# Patient Record
Sex: Female | Born: 1955 | Race: White | Hispanic: No | Marital: Married | Smoking: Current every day smoker
Health system: Southern US, Community
[De-identification: ages and names within clinical notes are randomized; demographics above are authoritative.]

## PROBLEM LIST (undated history)

## (undated) DIAGNOSIS — I10 Essential (primary) hypertension: Secondary | ICD-10-CM

## (undated) DIAGNOSIS — E785 Hyperlipidemia, unspecified: Secondary | ICD-10-CM

## (undated) DIAGNOSIS — F1910 Other psychoactive substance abuse, uncomplicated: Secondary | ICD-10-CM

## (undated) DIAGNOSIS — I219 Acute myocardial infarction, unspecified: Secondary | ICD-10-CM

## (undated) DIAGNOSIS — Z720 Tobacco use: Secondary | ICD-10-CM

## (undated) DIAGNOSIS — I2510 Atherosclerotic heart disease of native coronary artery without angina pectoris: Secondary | ICD-10-CM

## (undated) DIAGNOSIS — I739 Peripheral vascular disease, unspecified: Secondary | ICD-10-CM

## (undated) DIAGNOSIS — I779 Disorder of arteries and arterioles, unspecified: Secondary | ICD-10-CM

## (undated) HISTORY — DX: Tobacco use: Z72.0

## (undated) HISTORY — DX: Disorder of arteries and arterioles, unspecified: I77.9

## (undated) HISTORY — PX: OTHER SURGICAL HISTORY: SHX169

## (undated) HISTORY — DX: Peripheral vascular disease, unspecified: I73.9

## (undated) HISTORY — DX: Hyperlipidemia, unspecified: E78.5

## (undated) HISTORY — PX: CERVICAL FUSION: SHX112

## (undated) HISTORY — DX: Atherosclerotic heart disease of native coronary artery without angina pectoris: I25.10

## (undated) HISTORY — DX: Other psychoactive substance abuse, uncomplicated: F19.10

## (undated) HISTORY — PX: VAGINAL HYSTERECTOMY: SUR661

## (undated) HISTORY — DX: Acute myocardial infarction, unspecified: I21.9

---

## 1997-10-27 ENCOUNTER — Ambulatory Visit: Admission: RE | Admit: 1997-10-27 | Discharge: 1997-10-27 | Payer: Self-pay | Admitting: Otolaryngology

## 1997-11-25 ENCOUNTER — Ambulatory Visit (HOSPITAL_BASED_OUTPATIENT_CLINIC_OR_DEPARTMENT_OTHER): Admission: RE | Admit: 1997-11-25 | Discharge: 1997-11-25 | Payer: Self-pay | Admitting: Otolaryngology

## 1998-10-06 ENCOUNTER — Ambulatory Visit (HOSPITAL_COMMUNITY): Admission: RE | Admit: 1998-10-06 | Discharge: 1998-10-06 | Payer: Self-pay | Admitting: Gastroenterology

## 1999-04-28 ENCOUNTER — Other Ambulatory Visit (HOSPITAL_COMMUNITY): Admission: RE | Admit: 1999-04-28 | Discharge: 1999-06-02 | Payer: Self-pay | Admitting: Psychiatry

## 2004-04-13 ENCOUNTER — Ambulatory Visit: Payer: Self-pay | Admitting: Internal Medicine

## 2004-04-15 ENCOUNTER — Ambulatory Visit: Payer: Self-pay | Admitting: *Deleted

## 2004-05-04 ENCOUNTER — Ambulatory Visit: Payer: Self-pay | Admitting: Internal Medicine

## 2006-06-14 ENCOUNTER — Encounter: Admission: RE | Admit: 2006-06-14 | Discharge: 2006-06-14 | Payer: Self-pay | Admitting: Family Medicine

## 2006-08-01 ENCOUNTER — Encounter: Admission: RE | Admit: 2006-08-01 | Discharge: 2006-08-01 | Payer: Self-pay | Admitting: Family Medicine

## 2006-11-24 ENCOUNTER — Encounter (INDEPENDENT_AMBULATORY_CARE_PROVIDER_SITE_OTHER): Payer: Self-pay | Admitting: Specialist

## 2006-11-24 ENCOUNTER — Inpatient Hospital Stay (HOSPITAL_COMMUNITY): Admission: RE | Admit: 2006-11-24 | Discharge: 2006-11-25 | Payer: Self-pay | Admitting: Obstetrics and Gynecology

## 2007-12-01 ENCOUNTER — Ambulatory Visit: Admission: RE | Admit: 2007-12-01 | Discharge: 2007-12-01 | Payer: Self-pay | Admitting: Family Medicine

## 2008-05-15 ENCOUNTER — Encounter: Payer: Self-pay | Admitting: Neurosurgery

## 2008-05-17 ENCOUNTER — Ambulatory Visit (HOSPITAL_COMMUNITY): Admission: RE | Admit: 2008-05-17 | Discharge: 2008-05-18 | Payer: Self-pay | Admitting: Neurosurgery

## 2008-05-25 ENCOUNTER — Emergency Department (HOSPITAL_COMMUNITY): Admission: EM | Admit: 2008-05-25 | Discharge: 2008-05-25 | Payer: Self-pay | Admitting: Emergency Medicine

## 2008-12-10 DIAGNOSIS — I219 Acute myocardial infarction, unspecified: Secondary | ICD-10-CM

## 2008-12-10 DIAGNOSIS — I2510 Atherosclerotic heart disease of native coronary artery without angina pectoris: Secondary | ICD-10-CM

## 2008-12-10 HISTORY — DX: Atherosclerotic heart disease of native coronary artery without angina pectoris: I25.10

## 2008-12-10 HISTORY — PX: CORONARY ANGIOPLASTY: SHX604

## 2008-12-10 HISTORY — DX: Acute myocardial infarction, unspecified: I21.9

## 2008-12-17 ENCOUNTER — Other Ambulatory Visit: Payer: Self-pay | Admitting: Emergency Medicine

## 2008-12-17 ENCOUNTER — Inpatient Hospital Stay (HOSPITAL_COMMUNITY): Admission: EM | Admit: 2008-12-17 | Discharge: 2008-12-19 | Payer: Self-pay | Admitting: Cardiology

## 2009-01-15 ENCOUNTER — Encounter (HOSPITAL_COMMUNITY): Admission: RE | Admit: 2009-01-15 | Discharge: 2009-02-14 | Payer: Self-pay | Admitting: Cardiology

## 2009-01-15 ENCOUNTER — Ambulatory Visit: Payer: Self-pay | Admitting: Vascular Surgery

## 2009-02-14 ENCOUNTER — Encounter (HOSPITAL_COMMUNITY): Admission: RE | Admit: 2009-02-14 | Discharge: 2009-03-16 | Payer: Self-pay | Admitting: Cardiology

## 2009-05-04 ENCOUNTER — Inpatient Hospital Stay (HOSPITAL_COMMUNITY): Admission: EM | Admit: 2009-05-04 | Discharge: 2009-05-08 | Payer: Self-pay | Admitting: Psychiatry

## 2009-05-04 ENCOUNTER — Ambulatory Visit: Payer: Self-pay | Admitting: Psychiatry

## 2009-05-04 ENCOUNTER — Emergency Department (HOSPITAL_COMMUNITY): Admission: EM | Admit: 2009-05-04 | Discharge: 2009-05-04 | Payer: Self-pay | Admitting: Emergency Medicine

## 2009-05-12 ENCOUNTER — Other Ambulatory Visit (HOSPITAL_COMMUNITY): Admission: RE | Admit: 2009-05-12 | Discharge: 2009-06-25 | Payer: Self-pay | Admitting: Psychiatry

## 2009-05-15 ENCOUNTER — Ambulatory Visit: Payer: Self-pay | Admitting: Psychiatry

## 2010-06-18 ENCOUNTER — Ambulatory Visit: Payer: Self-pay | Admitting: Cardiology

## 2010-08-25 ENCOUNTER — Other Ambulatory Visit (HOSPITAL_COMMUNITY): Payer: Self-pay | Admitting: Orthopaedic Surgery

## 2010-08-25 DIAGNOSIS — M79671 Pain in right foot: Secondary | ICD-10-CM

## 2010-08-26 ENCOUNTER — Encounter (HOSPITAL_COMMUNITY)
Admission: RE | Admit: 2010-08-26 | Discharge: 2010-08-26 | Disposition: A | Discharge status: Home / Self Care | Payer: BC Managed Care – PPO | Source: Ambulatory Visit | Attending: Orthopaedic Surgery | Admitting: Orthopaedic Surgery

## 2010-08-26 ENCOUNTER — Ambulatory Visit (HOSPITAL_COMMUNITY): Payer: Self-pay

## 2010-08-26 ENCOUNTER — Encounter (HOSPITAL_COMMUNITY): Payer: Self-pay

## 2010-08-26 DIAGNOSIS — M7730 Calcaneal spur, unspecified foot: Secondary | ICD-10-CM | POA: Insufficient documentation

## 2010-08-26 DIAGNOSIS — M25579 Pain in unspecified ankle and joints of unspecified foot: Secondary | ICD-10-CM | POA: Insufficient documentation

## 2010-08-26 DIAGNOSIS — M79671 Pain in right foot: Secondary | ICD-10-CM

## 2010-08-26 HISTORY — DX: Essential (primary) hypertension: I10

## 2010-08-26 MED ORDER — TECHNETIUM TC 99M MEDRONATE IV KIT
25.0000 | PACK | Freq: Once | INTRAVENOUS | Status: AC | PRN
  Administered 2010-08-26: 25 via INTRAVENOUS

## 2010-10-13 LAB — URINE DRUGS OF ABUSE SCREEN W ALC, ROUTINE (REF LAB)
Amphetamine Screen, Ur: NEGATIVE
Amphetamine Screen, Ur: NEGATIVE
Cocaine Metabolites: NEGATIVE
Cocaine Metabolites: NEGATIVE
Creatinine,U: 117.6 mg/dL
Marijuana Metabolite: NEGATIVE
Marijuana Metabolite: NEGATIVE
Methadone: NEGATIVE
Phencyclidine (PCP): NEGATIVE
Phencyclidine (PCP): NEGATIVE
Propoxyphene: NEGATIVE

## 2010-10-14 ENCOUNTER — Other Ambulatory Visit: Payer: Self-pay | Admitting: *Deleted

## 2010-10-14 DIAGNOSIS — I2510 Atherosclerotic heart disease of native coronary artery without angina pectoris: Secondary | ICD-10-CM

## 2010-10-14 LAB — URINE DRUGS OF ABUSE SCREEN W ALC, ROUTINE (REF LAB)
Barbiturate Quant, Ur: NEGATIVE
Barbiturate Quant, Ur: NEGATIVE
Benzodiazepines.: NEGATIVE
Benzodiazepines.: NEGATIVE
Benzodiazepines.: POSITIVE — AB
Cocaine Metabolites: NEGATIVE
Creatinine,U: 45.1 mg/dL
Ethyl Alcohol: <10 mg/dL (ref <10)
Ethyl Alcohol: <10 mg/dL (ref <10)
Marijuana Metabolite: NEGATIVE
Marijuana Metabolite: NEGATIVE
Methadone: NEGATIVE
Methadone: NEGATIVE
Opiate Screen, Urine: NEGATIVE
Phencyclidine (PCP): NEGATIVE
Propoxyphene: NEGATIVE

## 2010-10-14 LAB — BENZODIAZEPINE, QUANTITATIVE, URINE
Alprazolam (GC/LC/MS), ur confirm: 100 ng/mL
Flurazepam GC/MS Conf: NEGATIVE
Nordiazepam GC/MS Conf: NEGATIVE
Oxazepam GC/MS Conf: NEGATIVE

## 2010-10-14 MED ORDER — CLOPIDOGREL BISULFATE 75 MG PO TABS: 75.0000 mg | ORAL_TABLET | Freq: Every day | ORAL | Status: DC

## 2010-10-14 NOTE — Telephone Encounter (Signed)
escribe medication per fax request  

## 2010-10-15 LAB — RAPID URINE DRUG SCREEN, HOSP PERFORMED
Amphetamines: NOT DETECTED
Barbiturates: NOT DETECTED
Benzodiazepines: POSITIVE — AB
Opiates: POSITIVE — AB
Tetrahydrocannabinol: NOT DETECTED

## 2010-10-15 LAB — DIFFERENTIAL
Eosinophils Absolute: 0.3 10*3/uL (ref 0.0–0.7)
Monocytes Absolute: 0.7 10*3/uL (ref 0.1–1.0)
Monocytes Relative: 8 % (ref 3–12)
Neutro Abs: 4.3 10*3/uL (ref 1.7–7.7)

## 2010-10-15 LAB — COMPREHENSIVE METABOLIC PANEL
ALT: 25 U/L (ref 0–35)
AST: 25 U/L (ref 0–37)
Albumin: 3.4 g/dL — ABNORMAL LOW (ref 3.5–5.2)
BUN: 21 mg/dL (ref 6–23)
CO2: 28 mEq/L (ref 19–32)
Calcium: 8.3 mg/dL — ABNORMAL LOW (ref 8.4–10.5)
Creatinine, Ser: 0.84 mg/dL (ref 0.4–1.2)
GFR calc Af Amer: >60 mL/min (ref >60)
Potassium: 3.4 mEq/L — ABNORMAL LOW (ref 3.5–5.1)

## 2010-10-15 LAB — URINE MICROSCOPIC-ADD ON

## 2010-10-15 LAB — URINALYSIS, ROUTINE W REFLEX MICROSCOPIC: Leukocytes, UA: NEGATIVE

## 2010-10-15 LAB — PREGNANCY, URINE: Preg Test, Ur: NEGATIVE

## 2010-10-19 LAB — DIFFERENTIAL
Basophils Absolute: 0 10*3/uL (ref 0.0–0.1)
Eosinophils Absolute: 0.1 10*3/uL (ref 0.0–0.7)
Eosinophils Relative: 1 % (ref 0–5)
Lymphocytes Relative: 12 % (ref 12–46)
Lymphs Abs: 2.3 10*3/uL (ref 0.7–4.0)
Monocytes Absolute: 1.2 10*3/uL — ABNORMAL HIGH (ref 0.1–1.0)

## 2010-10-19 LAB — BASIC METABOLIC PANEL
Chloride: 97 mEq/L (ref 96–112)
GFR calc non Af Amer: >60 mL/min (ref >60)
Glucose, Bld: 191 mg/dL — ABNORMAL HIGH (ref 70–99)
Potassium: 3.7 mEq/L (ref 3.5–5.1)
Sodium: 136 mEq/L (ref 135–145)

## 2010-10-19 LAB — URINALYSIS, MICROSCOPIC ONLY
Bilirubin Urine: NEGATIVE
Hgb urine dipstick: NEGATIVE
Nitrite: NEGATIVE
Protein, ur: NEGATIVE mg/dL
Specific Gravity, Urine: 1.022 (ref 1.005–1.030)
Urobilinogen, UA: 0.2 mg/dL (ref 0.0–1.0)

## 2010-10-19 LAB — COMPREHENSIVE METABOLIC PANEL
ALT: 15 U/L (ref 0–35)
AST: 38 U/L — ABNORMAL HIGH (ref 0–37)
Albumin: 3.2 g/dL — ABNORMAL LOW (ref 3.5–5.2)
CO2: 33 mEq/L — ABNORMAL HIGH (ref 19–32)
Calcium: 9.2 mg/dL (ref 8.4–10.5)
Chloride: 100 mEq/L (ref 96–112)
Creatinine, Ser: 0.76 mg/dL (ref 0.4–1.2)
GFR calc Af Amer: >60 mL/min (ref >60)
GFR calc non Af Amer: >60 mL/min (ref >60)
Sodium: 140 mEq/L (ref 135–145)
Total Bilirubin: 0.2 mg/dL — ABNORMAL LOW (ref 0.3–1.2)

## 2010-10-19 LAB — BENZODIAZEPINE, QUANTITATIVE, URINE
Alprazolam (GC/LC/MS), ur confirm: 280 ng/mL
Flurazepam GC/MS Conf: NEGATIVE
Nordiazepam GC/MS Conf: NEGATIVE
Oxazepam GC/MS Conf: 190 ng/mL

## 2010-10-19 LAB — DRUGS OF ABUSE SCREEN W/O ALC, ROUTINE URINE
Amphetamine Screen, Ur: NEGATIVE
Barbiturate Quant, Ur: NEGATIVE
Benzodiazepines.: POSITIVE — AB
Cocaine Metabolites: NEGATIVE
Creatinine,U: 162.1 mg/dL
Marijuana Metabolite: NEGATIVE
Opiate Screen, Urine: POSITIVE — AB

## 2010-10-19 LAB — CBC
HCT: 40.9 % (ref 36.0–46.0)
Hemoglobin: 14.6 g/dL (ref 12.0–15.0)
MCV: 99.6 fL (ref 78.0–100.0)
Platelets: 248 10*3/uL (ref 150–400)
RBC: 3.79 MIL/uL — ABNORMAL LOW (ref 3.87–5.11)
RDW: 13.5 % (ref 11.5–15.5)
WBC: 11.8 10*3/uL — ABNORMAL HIGH (ref 4.0–10.5)

## 2010-10-19 LAB — CARDIAC PANEL(CRET KIN+CKTOT+MB+TROPI)
Total CK: 328 U/L — ABNORMAL HIGH (ref 7–177)
Troponin I: 5.7 ng/mL (ref 0.00–0.06)

## 2010-10-19 LAB — OPIATE, QUANTITATIVE, URINE
Hydromorphone GC/MS Conf: 120 ng/mL
Oxymorphone: 250 ng/mL

## 2010-10-19 LAB — LIPID PANEL
Cholesterol: 164 mg/dL (ref 0–200)
HDL: 29 mg/dL — ABNORMAL LOW (ref >39)
LDL Cholesterol: 104 mg/dL — ABNORMAL HIGH (ref 0–99)
Total CHOL/HDL Ratio: 5.7 RATIO
VLDL: 31 mg/dL (ref 0–40)

## 2010-10-19 LAB — POCT CARDIAC MARKERS: Troponin i, poc: <0.05 ng/mL (ref 0.00–0.09)

## 2010-10-19 LAB — TSH: TSH: 3.292 u[IU]/mL (ref 0.350–4.500)

## 2010-10-28 ENCOUNTER — Encounter: Payer: Self-pay | Admitting: Cardiology

## 2010-11-24 NOTE — H&P (Signed)
[name], [name]                 ACCOUNT NO.:  [id number]   MEDICAL RECORD NO.:  [id number]          PATIENT TYPE:  INP   LOCATION:  2919                         FACILITY:  [facility]   PHYSICIAN:  Peter M. Swaziland, M.D.  DATE OF BIRTH:  1955/09/06   DATE OF ADMISSION:  12/17/2008  DATE OF DISCHARGE:                              HISTORY & PHYSICAL   HISTORY OF PRESENT ILLNESS:  Ms. Amorin is a 55 year old white female  with history of hypertension, tobacco abuse, who presented to the [facility] today with acute onset of substernal chest pain radiating  to her arms and associated with weakness and a feeling of uneasiness.  She had no shortness of breath or nausea or vomiting.  The pain onset  was approximately one to one and a half hours prior to her presentation.  Initial ECG at [facility] showed 2 mm of ST-segment elevation in  inferior leads with reciprocal ST depression in leads I and aVL.  She  was transferred emergently to our facility for acute cardiac  catheterization.   PAST MEDICAL HISTORY:  1. Status post cervical fusion.  2. Depression.  3. Hypertension.  4. Status post hysterectomy.  5. History of colon polyps.  6. History of asthma.  7. 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   She describes allergies to ERYTHROMYCIN and possibly to DARVOCET.   Her current medications include:  1. Ambien CR 12.5 mg at bedtime.  2. Estradiol 2 mg daily.  3. Suboxone 8 mg/2 mg sublingual daily.  4. Hydrochlorothiazide 12.5 mg per day.  5. ProAir p.r.n.  6. She also has listed diazepam 5 mg per and tramadol 50 mg taken on a      p.r.n. basis.   SOCIAL HISTORY:  The patient is a homemaker.  She is married.  She has 2  children.  She has been a long-time smoker about one and a half pack per  day for 30 years.  She denies alcohol use.  She does have a prior  history of cocaine addiction.   FAMILY HISTORY:  Mother does have a history of coronary disease at age  30.  There  is no other family history of premature coronary artery  disease.   REVIEW OF SYSTEMS:  She denies any orthopnea, PND, or edema.  She has  had no history of TIA or stroke.  She has no history of bleeding  problems.  She does have some anxiety issues.  All other systems were  reviewed and were negative.   PHYSICAL EXAMINATION:  GENERAL:  The patient is a well-tanned white  female in moderate distress, who is very anxious.  VITAL SIGNS:  Blood pressure 104/60, pulse is 60 and regular.  She is  afebrile.  HEENT:  She is normocephalic, atraumatic.  Pupils equal, round, and  reactive to light and accommodation.  Sclerae clear.  Oropharynx is  clear with multiple missing teeth.  NECK:  Supple without JVD, adenopathy, thyromegaly, or bruits.  LUNGS:  Clear.  CARDIAC:  Regular rate and rhythm without gallop, murmur, rub, or click.  ABDOMEN:  Soft, nontender without mass or hepatosplenomegaly.  EXTREMITIES:  Femoral and pedal pulses were 2+ and symmetric.  She has  no edema.  SKIN:  Warm and dry.  NEUROLOGIC:  She is alert and oriented x4.  Cranial nerves II through  XII are intact.  There are no gross focal findings.   LABORATORY DATA:  ECG demonstrated normal sinus rhythm with ST elevation  of 2 mm in leads II, III, and aVF with reciprocal ST-segment depression  in leads I and aVL.  White count was elevated at 18,900, hemoglobin  14.6, hematocrit 40.9, platelets 288,000.  Sodium 136, potassium 3.7,  chloride 97, CO2 of 29, BUN 11, creatinine 0.72, glucose was 191,  calcium was 9.3.  Initial point-of-care cardiac markers show CPK-MB of  1, troponin of less than 0.05.   IMPRESSION:  1. Acute ST-elevation inferior myocardial infarction.  2. Hypertension.  3. Tobacco abuse.  4. Prior history of cocaine use.  5. History of depression.  6. Hormone replacement therapy.   PLAN:  The patient will be taken emergently for cardiac catheterization  with potential coronary intervention.            ______________________________  Peter M. Swaziland, M.D.     PMJ/MEDQ  D:  12/17/2008  T:  12/18/2008  Job:  [id number]   cc:   [name], M.D.

## 2010-11-24 NOTE — Op Note (Signed)
[name], [name]                 ACCOUNT NO.:  [id number]   MEDICAL RECORD NO.:  [id number]          PATIENT TYPE:  OIB   LOCATION:  3023                         FACILITY:  [facility]   PHYSICIAN:  Hilda Lias, M.D.   DATE OF BIRTH:  1956/03/21   DATE OF PROCEDURE:  05/17/2008  DATE OF DISCHARGE:                               OPERATIVE REPORT   PREOPERATIVE DIAGNOSIS:  C5-C6 and C6-C7 spondylosis with radiculopathy,  left worse than right.   POSTOPERATIVE DIAGNOSIS:  C5-C6 and C6-C7 spondylosis with  radiculopathy, left worse than right.   PROCEDURE:  Anterior C5-C6 and C6-C7 diskectomy, decompression of the  spinal cord, bilateral foraminotomy, interbody fusion with autograft and  allograft plate with microscope.   SURGEON:  Hilda Lias, MD   CLINICAL HISTORY:  [name] is a 55 year old female complaining of  neck pain, worsen to the left upper extremity, associated with neck pain  with moving.  She had failed conservative treatment including epidural  injection.  She had full conservative treatment by different surgeon.  X-  ray showed a herniated disk at L5-L6 and L6-L7.  Surgery was advised.   PROCEDURE:  The patient was taken to the OR and after intubation, the  left side of the neck was cleaned with DuraPrep.  A transverse incision  was made through the skin, subcutaneous tissue, and platysma.  X-rays  showed that we were at the level of C4-C5.  From then on, we identified  C5-C6 and C6-C7.  Large anterior osteophyte was removed.  We entered  disk space and we found that indeed the patient had quite a bit of  degenerative disk.  The posterior ligament was opened.  There was quite  a bit of narrowed posteriorly.  The posterior ligament was opened and  then decompression of the spinal cord as well as the both C6 nerve root  was done.  Both foramen were really narrowed, the left worse than the  right one.  At the level of C6-C7, we found the similar to same findings  except that the left side had a large herniated disk.  Decompression was  achieved.  From then on, both the endplates of C5-C6 and C6-C7 were  removed and 2 piece of allograft of 7 mm, lordotic, with autograft  inside were inserted.  DBX was used to glue the autograft together.  From then on, a plate using 5 screws was done.  Lateral cervical spine  showed good position of the graft and the plate.  From then on, we  waited for 5 minutes just to be sure that we achieved good hemostasis.  Once the area was absolutely dry, the wound was closed with Vicryl and  Steri-Strips.           ______________________________  Hilda Lias, M.D.     EB/MEDQ  D:  05/17/2008  T:  05/18/2008  Job:  [id number]

## 2010-11-24 NOTE — Discharge Summary (Signed)
[name], [name]                 ACCOUNT NO.:  [id number]   MEDICAL RECORD NO.:  [id number]          PATIENT TYPE:  INP   LOCATION:  2037                         FACILITY:  [facility]   PHYSICIAN:  Peter M. Swaziland, M.D.  DATE OF BIRTH:  1956-02-17   DATE OF ADMISSION:  12/17/2008  DATE OF DISCHARGE:  12/19/2008                               DISCHARGE SUMMARY   HISTORY OF PRESENT ILLNESS:  Ms. Fedorko is a 55 year old white female  with history of hypertension and tobacco abuse, who presented with acute  onset of substernal chest pain radiating to her arms bilaterally,  associated weakness and feeling of uneasiness.  Initial ECG showed 2 mm  of ST-segment elevation in inferior leads with reciprocal ST depression  in leads I and aVL.  She was transferred to our facility for acute  management.  She has no prior cardiac history.   For details of her past medical history, social history, family history,  and physical exam, please see admission history and physical.   LABORATORY DATA:  White count was 18,900, hemoglobin 14.6, hematocrit  40.9, platelets 288,000.  Sodium 136, potassium 3.7, chloride 97, CO2 of  29, BUN 11, creatinine 0.72, glucose of 191, AST was 38.  Other liver  function studies were normal.  Albumin was 3.2, calcium was 9.3.  A1c  was 5.8%.  TSH was 3.292.  Initial point-of-care cardiac markers showed  a CPK-MB of 1 and troponin of less than 0.5.  Subsequent cardiac enzymes  showed elevation of the troponin to 5.7.  CK increased to 328 with 43.5  MB.  Cholesterol was 164, LDL 104, HDL 29, triglycerides of 155.   HOSPITAL COURSE:  The patient was taken emergently to the cardiac  catheterization laboratory.  This study demonstrated moderate 3-vessel  disease.  There was a 60-70% stenosis in the mid LAD.  The first  diagonal branch had a 40-50% stenosis in the midvessel.  The circumflex  had a 50% stenosis in the midvessel.  The right coronary artery was  diffusely diseased  throughout the mid segment, but had a focal 99%  stenosis after the right ventricular marginal branch with only TIMI  grade 1 flow.  We proceeded at this time with angioplasty of the mid  right coronary artery using a 2.5 x 15-mm apex balloon.  This resulted  in an excellent result with TIMI grade 3 flow.  There was less than 20%  residual.  Given the extent of disease in the mid right coronary artery,  we did elect to not to stent this vessel since it will require a very  long stent to cover all the plaque burden in a relatively small vessel.  The patient was treated post procedure with aspirin and Plavix.  She was  significantly bradycardic throughout her hospital stay with heart rates  between 50 and 60, and so we did not add a beta-blocker.  In fact, her  hydrochlorothiazide was held and her blood pressure remained low  throughout her hospital stay.  She was therefore not treated with any  antihypertensive therapy.  She was  treated with aggressive statin  therapy.  We recommended stopping her hormone replacement therapy.  She  was counseled on smoking cessation.  Following procedure, she had no  complications.  Her groin remained stable without hematoma.  Her white  count declined to 11,800.  Her ECG showed resolution of ST-segment  elevation with a residual T-wave inversion inferiorly and very small Q-  waves.  She was seen by cardiac rehab and ambulated without difficulty.  An echocardiogram was ordered what was pending at the time of discharge.  The patient was felt to be stable for discharge on December 19, 2008.   DISCHARGE DIAGNOSES:  1. Acute ST-elevation inferior myocardial infarction.  2. Moderate 3-vessel coronary artery disease.  3. Hypertension.  4. Tobacco abuse.  5. Family history of heart disease.  6. History of depression.   DISCHARGE MEDICATIONS:  1. Aspirin 325 mg per day.  2. Plavix 75 mg per day.  3. Crestor 20 mg per day.  4. Nitroglycerin sublingual p.r.n.   5. Suboxone 8/2 mg sublingual daily.  6. Ambien CR 12.5 mg at bedtime.   We recommended stopping her estrogen and hydrochlorothiazide.  We have  encouraged the patient to enroll in phase II cardiac rehabilitation  program.  Recommended smoking cessation.  She will follow up with Dr.  Swaziland in 2 weeks.  Discharge status is improved.           ______________________________  Peter M. Swaziland, M.D.     PMJ/MEDQ  D:  12/19/2008  T:  12/19/2008  Job:  [id number]   cc:   [name], M.D.

## 2010-11-24 NOTE — Procedures (Signed)
CAROTID DUPLEX EXAM   INDICATION:  Right carotid bruit.   HISTORY:  Diabetes:  No.  Cardiac:  No.  Hypertension:  Yes.  Smoking:  Yes.  Previous Surgery:  No.  CV History:  No.  Amaurosis Fugax No, Paresthesias No, Hemiparesis No                                       RIGHT             LEFT  Brachial systolic pressure:         116               82  Brachial Doppler waveforms:         WNL               Biphasic  Vertebral direction of flow:        Antegrade         Retrograde  DUPLEX VELOCITIES (cm/sec)  CCA peak systolic                   131               159  ECA peak systolic                   124               122  ICA peak systolic                   129               144  ICA end diastolic                   42                54  PLAQUE MORPHOLOGY:                  Homogeneous       Homogeneous  PLAQUE AMOUNT:                      Mild              Mild  PLAQUE LOCATION:                    Bif/ICA           Bif/ICA   IMPRESSION:  1. Bilateral 40-59% internal carotid artery stenosis.  2. Retrograde flow noted in left vertebral artery.       ___________________________________________  Di Kindle. Edilia Bo, M.D.   AC/MEDQ  D:  01/15/2009  T:  01/15/2009  Job:  [id number]

## 2010-11-24 NOTE — Cardiovascular Report (Signed)
[name], [name] NO.:  [id number]   MEDICAL RECORD NO.:  [id number]          PATIENT TYPE:  INP   LOCATION:  2919                         FACILITY:  [facility]   PHYSICIAN:  Peter M. Swaziland, M.D.  DATE OF BIRTH:  [birth year]   DATE OF PROCEDURE:  12/17/2008  DATE OF DISCHARGE:                            CARDIAC CATHETERIZATION   INDICATIONS FOR PROCEDURE:  A 55 year old white female with history of  tobacco use and hypertension presented to [facility] today with  acute onset of substernal chest pain.  Pain began approximately 1 to 1-  1/2 hours prior to presentation.  ECG showed ST elevation myocardial  infarction involving the inferior leads with reciprocal ST depression in  the lateral leads.  The patient was transferred to our facility for  immediate PTCA.   PROCEDURES:  Left heart catheterization, coronary and left ventricular  angiography, and percutaneous balloon angioplasty of the mid right  coronary artery.   ACCESS:  Via the right femoral artery.   EQUIPMENT:  A 6-French 4 cm right and left Judkins catheter, 6-French  pigtail catheter, 6-French arterial sheath, 6-French FR-4 guide with  side holes 0.014 Prowater wire, a 2.0 x 15 mm apex balloon, and a 2.5 x  15 mm apex balloon.   CONTRAST:  160 mL of Omnipaque.   MEDICATIONS:  Local anesthesia with 1% Xylocaine, fentanyl total 50 mcg  IV, Versed a total of 3 mg IV, Angiomax 0.75 mg/kg IV bolus followed by  continuous infusion of 1.75 mg/kg per hour, Plavix 600 mg p.o., atropine  1 mg IV, nitroglycerin 100 mcg intracoronary.  At the end of the  procedure, her right groin was closed using Angio-Seal device with  excellent hemostasis.   ANGIOGRAPHIC DATA:  The left coronary artery rises and distributes  normally.  The left main coronary artery has approximately 20% narrowing  in the distal vessel.   There is a 60-70% stenosis in the mid-LAD.  There is a moderate-sized  diagonal branch,  which has 40-50% stenosis in the midvessel.   The left circumflex coronary artery gives rise to three marginal  branches.  The second marginal branch is the largest of the three.  There is a 50% stenosis in the mid circumflex prior to the second  marginal branch.   The right coronary artery is a dominant vessel.  It has segmental 40-50%  stenosis in the proximal to mid vessel.  This is followed by 99%  stenosis in the midvessel after the takeoff of the right ventricular  marginal branch.  There is TIMI grade 1 flow distally.   We proceeded at this point with immediate angioplasty.  After  anticoagulation, the lesion was crossed easily with the wire.  We  initially dilated the lesion with a 2.0 apex balloon up to 18  atmospheres.  This resulted in good angiographic flow.  However, after  this inflation, the patient became markedly bradycardic and hypotensive.  She did respond to IV atropine.  She had some short runs of accelerated  idioventricular rhythm.  Afterwards, she had no further hemodynamic  instability  and her rhythm remained stable.  We then dilated the lesion  with a 2.5-mm apex balloon up to 8 atmospheres.  This yielded an  excellent angiographic result with less than 20% residual stenosis and  TIMI grade 3 flow.  We considered stenting of this vessel, but the  entire mid right coronary artery was diffusely diseased and would  require a substantially long stent to cover the diseased segment.  This  is also a relatively small vessel.  There is also concern that this  would result in jailing of the right ventricular marginal branch.  Since  we had a very acceptable result with just balloon angioplasty, we  decided to end the procedure at this point.  The patient was painfree.  Her ST segments were at baseline.   At this point, we will perform left ventricular angiography.  This  demonstrated normal left ventricular size and contractility with normal  systolic function.   Ejection fraction was estimated at 60%.  There was  no mitral regurgitation or prolapse.   FINAL INTERPRETATION:  1. Moderate three-vessel atherosclerotic coronary artery disease with      severe culprit lesion in the mid right coronary.  2. Normal left ventricular function.  3. Successful balloon angioplasty of the mid right coronary artery.   PLAN:  We will continue with aspirin and Plavix at this time.  We will  continue on lower dose Angiomax for few hours.  The patient will need to  be treated with statin therapy.  Recommend smoking cessation.  Given her  continued bradycardia at this point, we will hold the beta-blocker  therapy.           ______________________________  Peter M. Swazil[place], M.D.     PMJ/MEDQ  D:  12/17/2008  T:  12/18/2008  Job:  [id number]   cc:   [name], M.D.

## 2010-11-27 NOTE — H&P (Signed)
[name], [name]                 ACCOUNT NO.:  [id number]   MEDICAL RECORD NO.:  [id number]          PATIENT TYPE:  AMB   LOCATION:  SDC                           FACILITY:  [facility]   PHYSICIAN:  Zelphia Cairo, MD    DATE OF BIRTH:  05-08-1956   DATE OF ADMISSION:  DATE OF DISCHARGE:                              HISTORY & PHYSICAL   A 55 year old white female who presented originally in referral from Dr.  Collins Scotland in February 2008 with findings of a complex right ovarian mass.  She was having some on and off right back and flank pain which was  evaluated with a CT scan and pelvic ultrasounds.  Pelvic ultrasound  showed an increasingly complex right ovarian mass measuring 3.4 x 1.9 x  2.7 cm.  At this time the patient was also complaining of heavy  irregular menstrual cycles with increasing dysmenorrhea, passing blood  clots, and dyspareunia.   PAST MEDICAL HISTORY:  Negative.   SURGICAL HISTORY:  Nose surgery, tonsillectomy, tubal ligation.   SOCIAL HISTORY:  Tobacco use one pack a day. She is a prior crack  cocaine user. She has been sober for 3 years, denies any alcohol use.   ALLERGIES:  EES.   MEDICATIONS:  Ultram, Wellbutrin.   OB HISTORY:  Two vaginal deliveries, uncomplicated and one ectopic  pregnancy.   GYN HISTORY:  Irregular menses as described above. She does have a  history of cervical dysplasia which was treated with cryotherapy. Her  last Pap smear in July 2007 was normal by Dr. Collins Scotland.  She does report  painful intercourse as described above.   FAMILY HISTORY:  Is significant for a father with colon cancer and a  sister with uterine cancer at age 82.   PHYSICAL EXAM:  VITAL SIGNS:  Height 5 feet 8 inches, weight 160, blood  pressure 130/80, hemoglobin 12.5.  Urinalysis negative.  HEENT:  NECK:  Normal.  HEART:  Heart is regular rate and rhythm.  LUNGS:  Lungs were clear bilaterally.  ABDOMEN: Soft and nontender.  PELVIC:  Significant for normal external  female genitalia.  Vagina and  cervix are normal without lesions.  Uterus mobile and nontender.  There  is a palpable 4 cm mobile nontender mass in the right adnexa.  No left-  sided masses or tenderness are noted.  Sonohysterogram was performed  showing several intramural fibroids ranging between 8 mm and 1.9 cm.  There is also a complex right ovarian mass measuring 2.9 x 2.4 x 3.4 cm.  Left adnexa appeared normal.  No free fluid noted after saline infusion.  There were no intracavitary masses noted.  There is a questionable  uterine septum.   CA-125 was performed and returned at 11.2.   ASSESSMENT/PLAN:  A 55 year old white female with a complex right  ovarian mass dysmenorrhea and menorrhagia.  We discussed options.  The  patient elects for LAVH with BSO.  Risks, benefits and alternatives were  discussed.      Zelphia Cairo, MD  Electronically Signed     GA/MEDQ  D:  11/23/2006  T:  11/23/2006  Job:  [id number]

## 2010-11-27 NOTE — Op Note (Signed)
[name], [name]                 ACCOUNT NO.:  [id number]   MEDICAL RECORD NO.:  [id number]          PATIENT TYPE:  INP   LOCATION:  9318                          FACILITY:  [facility]   PHYSICIAN:  Zelphia Cairo, MD    DATE OF BIRTH:  Jul 17, 1955   DATE OF PROCEDURE:  11/25/2006  DATE OF DISCHARGE:                               OPERATIVE REPORT   PREOPERATIVE DIAGNOSIS:  1. Menorrhagia  2. Dysmenorrhea.  3. Right ovarian mass.   POSTOPERATIVE DIAGNOSIS:  1. Menorrhagia  2. Dysmenorrhea.  3. Right ovarian mass.   PROCEDURE:  LAVH with left salpingo-oophorectomy and right oophorectomy.   SURGEON:  Dr. Renaldo Fiddler   ASSISTANT:  Duke Salvia. Marcelle Overlie, M.D.   SPECIMEN:  Uterus, left fallopian tube, and bilateral ovaries.   ESTIMATED BLOOD LOSS:  150 mL.   COMPLICATIONS:  None.   CONDITION:  Stable, extubated to recovery room.   PROCEDURE:  The patient was taken to the operating room where general  anesthesia was easily obtained. She was placed in the dorsal lithotomy  position using Allen stirrups.  She was prepped and draped in sterile  fashion and a Foley catheter was inserted sterilely.  A bivalve speculum  was then placed in the vagina and a single-tooth tenaculum on the  anterior lip of the cervix. Hulka clamp was placed to the cervix into  the uterus to provide uterine manipulation. Tenaculum and speculum were  then removed from the vagina and our attention was then turned to the  abdomen.   Infraumbilical skin incision was made with the scalpel and a blunt  trocar was placed through the infraumbilical incision under direct  visualization using the diagnostic laparoscopic. Once intraperitoneal  placement was confirmed, CO2 was turned on and the abdomen and pelvis  were insufflated.  An inspection of the abdomen and pelvis revealed  normal appearing uterus.  Absence of right fallopian tube, simple  appearing cyst on the left ovary.  Next a suprapubic incision was made  with  the scalpel and a 5 mm trocar was placed under direct  visualization. A blunt probe was placed through this trocar and the  bowel was swept out of the cul-de-sac.  Bilateral ureters were then  visualized and felt to be free of our surgical field.  The right ovary  was grasped and tented upward.  The infraumbilical ligament was  cauterized using the gyrus and cut.  Serial bites were then taken  staying just adjacent to the ovary and uterus to the level of the round  ligament.  The round ligament was then cauterized and cut with the  gyrus. Hemostasis was assured and our attention was turned to the left.   Left fallopian tube and ovary were tented upwards with a blunt grasper.  The infundibulopelvic ligament was then grasped with the gyrus,  cauterized and cut.  Serial bites were then taken down the broad  ligament staying just adjacent to the fallopian tube and the uterus to  the level of the broad ligament.  The broad ligament was then grasped,  cauterized and cut with the gyrus.  Hemostasis was assured bilaterally  and our attention was then turned to the vagina.  All instruments were  removed from the abdomen.  CO2 and lights were turned off of the scope.   A weighted speculum was then placed in the vagina.  The Deaver was  placed in the anterior vagina.  The cervix was grasped with a double  tooth tenaculum.  A circumferential incision was then made using the  Bovie around the cervix.  The posterior cul-de-sac was then entered  sharply using Mayo scissors.  The pubocervical fascia was then bluntly  dissected back from the cervix.  The uterosacral ligaments were grasped  bilaterally using the LigaSure, cauterized and cut with Mayo scissors.  The cardinal ligaments were then clamped bilaterally with the LigaSure,  cauterized and cut with Mayo scissors.  The anterior cul-de-sac was then  entered sharply with mayo scissors Uterine arteries and broad ligaments  were then serial clamped and  cut, clamped and cauterized using the  LigaSure and then cut with Mayo scissors.  Next the fundus of the uterus  was grasped with a thyroid tenaculum and delivered through the posterior  cul-de-sac. The remaining pedicle was cauterized with the LigaSure and  cut with Mayo scissors.  The specimen was passed off to pathology.  The  posterior vaginal cuff was then sutured and hemostasis was noted. Figure-  of-eight stitches using 0 Vicryl were then used to transect bilateral  uterosacral ligaments.  0 Monocryl was then used to reapproximate the  peritoneum just behind the posterior vaginal cuff.  The remainder of the  vaginal cuff was then closed with figure-of-eight stitches of 0 Vicryl  in interrupted fashion once hemostasis was assured.  All instruments  were removed from the vagina and our attention returned to the abdomen.   The patient's abdomen and pelvis were reinsufflated and the cuff and all  pedicles were inspected and found to be hemostatic.  The pelvis was  irrigated with saline.  The CO2 was turned off to reinspect the pedicles  with decrease pneumoperitoneum and the cuff and pedicles were found to  be hemostatic.  CO2 was released.  All instruments and trocars removed  from the patient's abdomen.  The fascia of the infraumbilical incision  was closed with a figure-of-eight suture using Vicryl.  The skin of both  abdominal incisions was closed using 3-0 Vicryl.  The patient tolerated  this procedure well.  Sponge and needle and instrument counts were  correct x2.  She was taken to the recovery room in stable condition.      Zelphia Cairo, MD  Electronically Signed     GA/MEDQ  D:  11/25/2006  T:  11/25/2006  Job:  [id number]

## 2010-11-27 NOTE — Discharge Summary (Signed)
[name], [name]                 ACCOUNT NO.:  [id number]   MEDICAL RECORD NO.:  [id number]          PATIENT TYPE:  INP   LOCATION:  9318                          FACILITY:  [facility]   PHYSICIAN:  Zelphia Cairo, MD    DATE OF BIRTH:  [birth year]   DATE OF ADMISSION:  11/24/2006  DATE OF DISCHARGE:                               DISCHARGE SUMMARY   ADMISSION DIAGNOSES:  1. Menorrhagia.  2. Dysmenorrhea.  3. Right ovarian mass.   PROCEDURES:  LAVH/BSO.   HOSPITAL COURSE:  The patient was admitted to the hospital for LAVH/BSO.  Please see operative note for further details of the surgery.  Postoperatively, her pain was initially controlled with an IV PCA.  She  had a Foley catheter and IV fluids.  On the evening of postoperative day  #1, she was doing great.  She was ambulating and tolerating a liquid  diet.  She had good bowel sounds.  Her PCA was discontinued and she was  started on oral pain medications.  Her Foley catheter was discontinued  and she was ambulating to the restroom without difficulty.  Postoperative day #2 she continued to do well.  She was tolerating a  regular diet and ambulating.  She was urinating well without her Foley  and her pain was well-controlled with oral medications.  Her vital signs  were stable.  She was afebrile and her hemoglobin was 13.  Her abdomen  was soft, nontender, with good bowel sounds and her bandage was clean,  dry and intact.  She was doing well and desiring discharge.  She was  given prescriptions for Percocet, Climara patch, ibuprofen and Colace.  She was instructed to follow up in the office in 2 weeks for a  postoperative check.      Zelphia Cairo, MD  Electronically Signed     GA/MEDQ  D:  11/25/2006  T:  11/25/2006  Job:  [id number]

## 2010-11-27 NOTE — Procedures (Signed)
[name], [name]                 ACCOUNT NO.:  [id number]   MEDICAL RECORD NO.:  [id number]          PATIENT TYPE:  OUT   LOCATION:  SLEE                          FACILITY:  [facility]   PHYSICIAN:  Kofi A. Gerilyn Pilgrim, M.D. DATE OF BIRTH:  1956-04-19   DATE OF PROCEDURE:  DATE OF DISCHARGE:  12/01/2007                             SLEEP DISORDER REPORT   POLYSOMNOGRAPHY REPORT.   REFERRING PHYSICIAN:  [name], MD.   HISTORY:  This is a 55 year old lady who presents with snoring,  sleepiness and has been evaluated for obstructive sleep apnea syndrome.  Epworth sleepiness scale 12.  BMI 26.   MEDICATIONS:  1. Ultram.  2. Ambien.  3. Estradiol.  4. Hydrochlorothiazide.   SLEEP STAGE SUMMARY:  The total recording time is 422 minutes.  The  sleep efficiency is 96%.  Sleep latency 4 minutes.  REM latency 64  minutes.  Stage N1 is 8.2%, N2 is 43%, and N3 is 25%.  REM sleep 23%.   RESPIRATORY SUMMARY:  Baseline oxygen saturation 95%.  Lowest saturation  90%.  AHI is 9.   LEG MOVEMENT SUMMARY:  PLM index 0.   ELECTROCARDIOGRAM SUMMARY:  Average heart rate is 70 with occasional  PVCs observed.   IMPRESSION:  Mild obstructive sleep apnea syndrome not requiring  positive pressure.  Thanks for this referral.      Kofi A. Gerilyn Pilgrim, M.D.  Electronically Signed     KAD/MEDQ  D:  12/06/2007  T:  12/06/2007  Job:  [id number]

## 2010-12-04 ENCOUNTER — Encounter: Payer: Self-pay | Admitting: Cardiology

## 2010-12-18 ENCOUNTER — Encounter: Payer: Self-pay | Admitting: Cardiology

## 2011-01-14 ENCOUNTER — Encounter: Payer: Self-pay | Admitting: Cardiology

## 2011-01-15 ENCOUNTER — Encounter: Payer: Self-pay | Admitting: Cardiology

## 2011-01-20 ENCOUNTER — Encounter: Payer: Self-pay | Admitting: Cardiology

## 2011-01-22 ENCOUNTER — Ambulatory Visit: Payer: BC Managed Care – PPO | Admitting: Cardiology

## 2011-03-03 ENCOUNTER — Telehealth: Payer: Self-pay | Admitting: Cardiology

## 2011-03-03 MED ORDER — RAMIPRIL 5 MG PO TABS: 5.0000 mg | ORAL_TABLET | Freq: Every day | ORAL | Status: DC

## 2011-03-03 MED ORDER — ISOSORBIDE MONONITRATE ER 60 MG PO TB24: 60.0000 mg | ORAL_TABLET | Freq: Every day | ORAL | Status: DC

## 2011-03-03 MED ORDER — METOPROLOL SUCCINATE ER 25 MG PO TB24: 25.0000 mg | ORAL_TABLET | Freq: Every day | ORAL | Status: DC

## 2011-03-03 NOTE — Telephone Encounter (Signed)
Pt needs Ramipril, Netoprolol, and Isosorbide refilled, pharmacy is [facility] [phone], please call pt back to confirm this has been done, chart in box

## 2011-04-02 ENCOUNTER — Ambulatory Visit (INDEPENDENT_AMBULATORY_CARE_PROVIDER_SITE_OTHER): Payer: BC Managed Care – PPO | Admitting: Cardiology

## 2011-04-02 ENCOUNTER — Encounter: Payer: Self-pay | Admitting: Cardiology

## 2011-04-02 VITALS — BP 120/70 | HR 69 | Ht 67.0 in | Wt 174.4 lb

## 2011-04-02 DIAGNOSIS — I10 Essential (primary) hypertension: Secondary | ICD-10-CM

## 2011-04-02 DIAGNOSIS — F17200 Nicotine dependence, unspecified, uncomplicated: Secondary | ICD-10-CM

## 2011-04-02 DIAGNOSIS — I2510 Atherosclerotic heart disease of native coronary artery without angina pectoris: Secondary | ICD-10-CM

## 2011-04-02 DIAGNOSIS — I779 Disorder of arteries and arterioles, unspecified: Secondary | ICD-10-CM

## 2011-04-02 DIAGNOSIS — I219 Acute myocardial infarction, unspecified: Secondary | ICD-10-CM

## 2011-04-02 DIAGNOSIS — Z720 Tobacco use: Secondary | ICD-10-CM

## 2011-04-02 DIAGNOSIS — E785 Hyperlipidemia, unspecified: Secondary | ICD-10-CM

## 2011-04-02 NOTE — Patient Instructions (Addendum)
We will schedule you for a nuclear stress test.   We will schedule you for carotid doppler studies.   You need to quit smoking  Continue your current medications.  We will get a copy of your lab work from Dr. Collins Scotland.

## 2011-04-03 ENCOUNTER — Encounter: Payer: Self-pay | Admitting: Cardiology

## 2011-04-03 DIAGNOSIS — Z720 Tobacco use: Secondary | ICD-10-CM | POA: Insufficient documentation

## 2011-04-03 DIAGNOSIS — E785 Hyperlipidemia, unspecified: Secondary | ICD-10-CM | POA: Insufficient documentation

## 2011-04-03 DIAGNOSIS — I10 Essential (primary) hypertension: Secondary | ICD-10-CM | POA: Insufficient documentation

## 2011-04-03 NOTE — Assessment & Plan Note (Signed)
She has symptoms concerning for recurrent angina. We will schedule her for a Lexascan Cardiolite study. If her stress test is abnormal she will need a cardiac catheterization.

## 2011-04-03 NOTE — Assessment & Plan Note (Signed)
Pressures adequately controlled on current medications.

## 2011-04-03 NOTE — Progress Notes (Signed)
[name] Date of Birth: 01/03/1956   History of Present Illness: [name] is seen today for followup. 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 She was noted to have moderate three-vessel disease at that time. She was last seen in June of 2011. She reports that over the past week she has been experiencing symptoms of chest heaviness that cuts her breath off. It typically lasts less than 15 minutes. She has not taken any nitroglycerin. She has had some right arm discomfort as well. She continues to smoke at least one pack per day. She denies any drug use.  Current Outpatient Prescriptions on File Prior to Visit  Medication Sig Dispense Refill  . aspirin 325 MG tablet Take 325 mg by mouth daily.        . cloNIDine (CATAPRES) 0.1 MG tablet Take 0.1 mg by mouth 2 (two) times daily.        . clopidogrel (PLAVIX) 75 MG tablet Take 1 tablet (75 mg total) by mouth daily.  30 tablet  5  . DULoxetine (CYMBALTA) 60 MG capsule Take 60 mg by mouth daily.        . eszopiclone (LUNESTA) 2 MG TABS Take 2 mg by mouth as needed. Take immediately before bedtime       . isosorbide mononitrate (IMDUR) 60 MG 24 hr tablet Take 1 tablet (60 mg total) by mouth daily.  30 tablet  4  . levothyroxine (SYNTHROID, LEVOTHROID) 25 MCG tablet Take 25 mcg by mouth daily.        . metFORMIN (GLUCOPHAGE-XR) 500 MG 24 hr tablet Take 1 tablet by mouth Daily.      . metoprolol succinate (TOPROL-XL) 25 MG 24 hr tablet Take 1 tablet (25 mg total) by mouth daily.  30 tablet  4  . nitroGLYCERIN (NITROSTAT) 0.4 MG SL tablet Place 0.4 mg under the tongue every 5 (five) minutes as needed.        . ramipril (ALTACE) 5 MG tablet Take 1 tablet (5 mg total) by mouth daily.  30 tablet  4  . rosuvastatin (CRESTOR) 20 MG tablet Take 20 mg by mouth daily.        Marland Kitchen thyroid (ARMOUR) 60 MG tablet Take 90 mg by mouth daily.       . traZODone  (DESYREL) 100 MG tablet Take 100 mg by mouth at bedtime.        Marland Kitchen HYDROcodone-acetaminophen (VICODIN) 5-500 MG per tablet Ad lib.        Allergies  Allergen Reactions  . Darvocet (Propoxyphene N-Acetaminophen)   . Erythromycin     Past Medical History  Diagnosis Date  . Hypertension   . Coronary artery disease 12-2008    POST INFERIOR ST ELEVATION MYOCARDIAL INFARCTION  . MI (myocardial infarction) 12-2008  . Carotid arterial disease     RIGHT CAROTID BRUIT WITH 40-60% STENOSIS BILATERALLY  . Dyslipidemia   . Polysubstance abuse     WITH TOBACCO, ALCOHOL, AND CRACK COCAINE. NOW DRUG FREE FOR THE PAST YEAR    Past Surgical History  Procedure Date  . Coronary angioplasty 12/2008    RIGHT CORONARY   . Cervical fusion   . Removal of colon polyps     History  Smoking status  . Current Everyday Smoker -- 1.0 packs/day  . Types: Cigarettes  Smokeless tobacco  . Not on file    History  Alcohol Use  No    Family History  Problem Relation Age of Onset  . Coronary artery disease Mother   . Heart disease Mother     Review of Systems: As noted history of present illness.  All other systems were reviewed and are negative.  Physical Exam: BP 120/70  Pulse 69  Ht 5\' 7"  (1.702 m)  Wt 174 lb 6.4 oz (79.107 kg)  BMI 27.31 kg/m2 The patient is alert and oriented x 3.  The mood and affect are normal.  The skin is warm and dry.  Color is normal.  The HEENT exam reveals that the sclera are nonicteric.  The mucous membranes are moist.  The carotids are 2+ without bruits.  There is no thyromegaly.  There is no JVD.  The lungs are clear.  The chest wall is non tender.  The heart exam reveals a regular rate with a normal S1 and S2.  There are no murmurs, gallops, or rubs.  The PMI is not displaced.   Abdominal exam reveals good bowel sounds.  There is no guarding or rebound.  There is no hepatosplenomegaly or tenderness.  There are no masses.  Exam of the legs reveal no clubbing,  cyanosis, or edema.  The legs are without rashes.  The distal pulses are intact.  Cranial nerves II - XII are intact.  Motor and sensory functions are intact.  The gait is normal.  LABORATORY DATA: ECG today demonstrates normal sinus rhythm with a normal ECG. Blood tests from April of 2012 showed a total cholesterol less than 100, HDL 22, triglycerides 116. LDL was not measured. CBC and chemistries at that time were normal except for glucose of 162.  Assessment / Plan:

## 2011-04-03 NOTE — Assessment & Plan Note (Signed)
She has moderate bilateral carotid disease based on prior Doppler study. We will repeat Doppler study at this time.

## 2011-04-03 NOTE — Assessment & Plan Note (Signed)
We discussed smoking cessation for 5-10 minutes. She is unwilling to quit at this time. She has tried Chantix in the past. We discussed strategies to quit when she is willing to proceed.

## 2011-04-13 LAB — CBC
HCT: 42.2
MCV: 100.7 — ABNORMAL HIGH
RBC: 4.19
WBC: 9.8

## 2011-04-13 LAB — HEPATIC FUNCTION PANEL
ALT: 16
AST: 18
Total Protein: 6.1

## 2011-04-13 LAB — BASIC METABOLIC PANEL
Chloride: 104
GFR calc Af Amer: >60
Potassium: 4.2

## 2011-04-14 ENCOUNTER — Encounter: Payer: Self-pay | Admitting: *Deleted

## 2011-04-26 ENCOUNTER — Ambulatory Visit (HOSPITAL_COMMUNITY): Payer: BC Managed Care – PPO | Attending: Cardiology | Admitting: Radiology

## 2011-04-26 ENCOUNTER — Encounter (INDEPENDENT_AMBULATORY_CARE_PROVIDER_SITE_OTHER): Payer: BC Managed Care – PPO | Admitting: *Deleted

## 2011-04-26 VITALS — Ht 67.0 in | Wt 167.0 lb

## 2011-04-26 DIAGNOSIS — I119 Hypertensive heart disease without heart failure: Secondary | ICD-10-CM

## 2011-04-26 DIAGNOSIS — I6529 Occlusion and stenosis of unspecified carotid artery: Secondary | ICD-10-CM

## 2011-04-26 DIAGNOSIS — I2510 Atherosclerotic heart disease of native coronary artery without angina pectoris: Secondary | ICD-10-CM | POA: Insufficient documentation

## 2011-04-26 DIAGNOSIS — R0789 Other chest pain: Secondary | ICD-10-CM

## 2011-04-26 DIAGNOSIS — I252 Old myocardial infarction: Secondary | ICD-10-CM

## 2011-04-26 DIAGNOSIS — R0609 Other forms of dyspnea: Secondary | ICD-10-CM

## 2011-04-26 MED ORDER — REGADENOSON 0.4 MG/5ML IV SOLN
0.4000 mg | Freq: Once | INTRAVENOUS | Status: AC
  Administered 2011-04-26: 0.4 mg via INTRAVENOUS

## 2011-04-26 MED ORDER — TECHNETIUM TC 99M TETROFOSMIN IV KIT
33.0000 | PACK | Freq: Once | INTRAVENOUS | Status: AC | PRN
  Administered 2011-04-26: 33 via INTRAVENOUS

## 2011-04-26 MED ORDER — TECHNETIUM TC 99M TETROFOSMIN IV KIT
11.0000 | PACK | Freq: Once | INTRAVENOUS | Status: AC | PRN
  Administered 2011-04-26: 11 via INTRAVENOUS

## 2011-04-26 NOTE — Progress Notes (Signed)
[facility] SITE 3 NUCLEAR MED [address] [place] [address] [phone]  Cardiology Nuclear Med Study  [name] is a 55 y.o. female [id number] 07/07/1956   Nuclear Med Background Indication for Stress Test:  Evaluation for Ischemia and PTCA Patency History: 06/10 Angioplasty, 12/17/08 EF 60% 99% RCA 3V DZ Heart Catheterization and 06/10 Myocardial Infarction: IWMI STEMI Cardiac Risk Factors: Carotid Disease, Family History - CAD, Hypertension, Lipids and Smoker  Symptoms:  Chest Pressure, Palpitations and SOB   Nuclear Pre-Procedure Caffeine/Decaff Intake:  None NPO After: 9:00pm   Lungs:  clear IV 0.9% NS with Angio Cath:  22g  IV Site: R Hand x 1, tolerated well IV Started by:  Irean Hong, RN  Chest Size (in):  36 Cup Size: B  Height: 5\' 7"  (1.702 m)  Weight:  167 lb (75.751 kg)  BMI:  Body mass index is 26.16 kg/(m^2). Tech Comments:  Held metformin and toprol this am    Nuclear Med Study 1 or 2 day study: 1 day  Stress Test Type:  Lexiscan  Reading MD: Olga Millers, MD  Order Authorizing Provider:  Peter [place], MD  Resting Radionuclide: Technetium 30m Tetrofosmin  Resting Radionuclide Dose: 11 mCi   Stress Radionuclide:  Technetium 84m Tetrofosmin  Stress Radionuclide Dose: 33 mCi           Stress Protocol Rest HR: 64 Stress HR: 90  Rest BP: 97/74 Stress BP: 128/77  Exercise Time (min): n/a METS: n/a   Predicted Max HR: 167 bpm % Max HR: 53.89 bpm Rate Pressure Product: 32440   Dose of Adenosine (mg):  n/a Dose of Lexiscan: 0.4 mg  Dose of Atropine (mg): n/a Dose of Dobutamine: n/a mcg/kg/min (at max HR)  Stress Test Technologist: Milana Na, EMT-P  Nuclear Technologist:  Domenic Polite, CNMT     Rest Procedure:  Myocardial perfusion imaging was performed at rest 45 minutes following the intravenous administration of Technetium 14m Tetrofosmin. Rest ECG: NSR  Stress Procedure:  The patient received IV Lexiscan 0.4  mg over 15-seconds.  Technetium 86m Tetrofosmin injected at 30-seconds.  CHB and occ pacs with infusion. There were no significant changes + sob, nausea, and feeling and feeling weird with Lexiscan.  Quantitative spect images were obtained after a 45 minute delay. Stress ECG: No significant ST segment change suggestive of ischemia.  QPS Raw Data Images:  Acquisition technically good; normal left ventricular size. Stress Images:  There is decreased uptake in the anteroseptal wall. Rest Images:  There is decreased uptake in the anteroseptal wall, less prominent compared to the stress images. Subtraction (SDS):  These findings are consistent with prior anteroseptal infarct and mild peri-infarct ischemia; some of defect may be related to shifting breast attenuation. Transient Ischemic Dilatation (Normal <1.22):  .96 Lung/Heart Ratio (Normal <0.45):  .31  Quantitative Gated Spect Images QGS EDV:  65 ml QGS ESV:  24 ml QGS cine images:  NL LV Function; NL Wall Motion QGS EF: 63%  Impression Exercise Capacity:  Lexiscan with no exercise. BP Response:  Normal blood pressure response. Clinical Symptoms:  No chest pain. ECG Impression:  No significant ST segment change suggestive of ischemia. Comparison with Prior Nuclear Study: No images to compare  Overall Impression:  Abnormal stress nuclear study with a moderate size partially reversible anteroseptal defect consistent with prior infarct and mild peri-infarct ischemia; some of defect may be related to shifting breast attenuation.       Olga Millers

## 2011-04-27 ENCOUNTER — Telehealth: Payer: Self-pay | Admitting: *Deleted

## 2011-04-27 NOTE — Telephone Encounter (Signed)
Message copied by Lorayne Bender on Tue Apr 27, 2011 11:24 AM ------      Message from: [place], PETER M      Created: Tue Apr 27, 2011  9:53 AM       Anterior wall defect is concerning with her recent symptoms. EF is normal. I think she needs a cardiac cath.      Theron Arista [place]

## 2011-04-27 NOTE — Telephone Encounter (Signed)
Notified of stress test results. Will see in office to discuss and schedule cardiac cath. App made for tomorrow at 2:45

## 2011-04-28 ENCOUNTER — Ambulatory Visit
Admission: RE | Admit: 2011-04-28 | Discharge: 2011-04-28 | Disposition: A | Discharge status: Home / Self Care | Payer: BC Managed Care – PPO | Source: Ambulatory Visit | Attending: Cardiology | Admitting: Cardiology

## 2011-04-28 ENCOUNTER — Encounter: Payer: Self-pay | Admitting: Cardiology

## 2011-04-28 ENCOUNTER — Ambulatory Visit (INDEPENDENT_AMBULATORY_CARE_PROVIDER_SITE_OTHER): Payer: BC Managed Care – PPO | Admitting: Cardiology

## 2011-04-28 VITALS — BP 142/96 | HR 99 | Ht 67.0 in | Wt 169.1 lb

## 2011-04-28 DIAGNOSIS — R9439 Abnormal result of other cardiovascular function study: Secondary | ICD-10-CM

## 2011-04-28 DIAGNOSIS — R079 Chest pain, unspecified: Secondary | ICD-10-CM

## 2011-04-28 DIAGNOSIS — I2510 Atherosclerotic heart disease of native coronary artery without angina pectoris: Secondary | ICD-10-CM

## 2011-04-28 LAB — BASIC METABOLIC PANEL
BUN: 17 mg/dL (ref 6–23)
CO2: 27 mEq/L (ref 19–32)
Calcium: 9.2 mg/dL (ref 8.4–10.5)
Creatinine, Ser: 1.2 mg/dL (ref 0.4–1.2)

## 2011-04-28 LAB — CBC WITH DIFFERENTIAL/PLATELET
Basophils Absolute: 0 10*3/uL (ref 0.0–0.1)
Basophils Relative: 0.2 % (ref 0.0–3.0)
Eosinophils Absolute: 0.1 10*3/uL (ref 0.0–0.7)
HCT: 45.7 % (ref 36.0–46.0)
Hemoglobin: 15.5 g/dL — ABNORMAL HIGH (ref 12.0–15.0)
Lymphs Abs: 2.5 10*3/uL (ref 0.7–4.0)
MCHC: 34 g/dL (ref 30.0–36.0)
Monocytes Relative: 8.7 % (ref 3.0–12.0)
Neutro Abs: 6.9 10*3/uL (ref 1.4–7.7)
RBC: 4.64 Mil/uL (ref 3.87–5.11)
RDW: 14.1 % (ref 11.5–14.6)

## 2011-04-28 LAB — PROTIME-INR: INR: 1 ratio (ref 0.8–1.0)

## 2011-04-28 NOTE — Assessment & Plan Note (Signed)
She has had a prior inferior myocardial infarction treated with direct angioplasty the right coronary. This was not stented because of fairly diffuse disease. She is having symptoms concerning for angina. Her Myoview study shows an anterior septal defect. I recommended reevaluation with cardiac catheterization. We will schedule this for this Friday, October 19. We will plan on using a radial approach. The procedure was explained in detail including possible intervention. Potential risk including but not exclusive of bleeding, vascular access complication, arrhythmia, myocardial infarction, stroke, dye reaction, and renal dysfunction were reviewed. The patient understands this and is agreeable to proceed.

## 2011-04-28 NOTE — Patient Instructions (Addendum)
We will get blood work today.  We will get a chest Xray.  We will schedule you for a cardiac cath with possible intervention on Friday October 19.  Hold Metformin the day of your angiogram and for 48 hours afterwards.  Need to be at short stay Friday Oct  19 at 8:30 am. Nothing to eat or drink after midnight. May take your medications that AM with sip of water except Metformin. Someone needs to come with you to drive and someone needs to stay with you first night at home.

## 2011-04-28 NOTE — Progress Notes (Signed)
[name] Date of Birth: 21-Dec-1955   History of Present Illness: [name] is seen today for followup. 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 She was noted to have moderate three-vessel disease at that time. She continues to experience symptoms of intermittent chest pressure and shortness of breath. She has some right arm discomfort. Recent nuclear stress testing demonstrated an anterior septal defect with partial reversibility. She has not taken any recent nitroglycerin. She does continue to smoke.  Current Outpatient Prescriptions on File Prior to Visit  Medication Sig Dispense Refill  . aspirin 325 MG tablet Take 325 mg by mouth daily.        . cloNIDine (CATAPRES) 0.1 MG tablet Take 0.1 mg by mouth daily.       . clopidogrel (PLAVIX) 75 MG tablet Take 1 tablet (75 mg total) by mouth daily.  30 tablet  5  . DULoxetine (CYMBALTA) 60 MG capsule Take 60 mg by mouth daily. Stopping soon to go back to lexapro      . eszopiclone (LUNESTA) 2 MG TABS Take 2 mg by mouth as needed. Take immediately before bedtime       . HYDROcodone-acetaminophen (VICODIN) 5-500 MG per tablet Ad lib.      . isosorbide mononitrate (IMDUR) 60 MG 24 hr tablet Take 1 tablet (60 mg total) by mouth daily.  30 tablet  4  . levothyroxine (SYNTHROID, LEVOTHROID) 25 MCG tablet Take 25 mcg by mouth daily.        . metFORMIN (GLUCOPHAGE-XR) 500 MG 24 hr tablet Take 1 tablet by mouth Daily.      . metoprolol succinate (TOPROL-XL) 25 MG 24 hr tablet Take 1 tablet (25 mg total) by mouth daily.  30 tablet  4  . nitroGLYCERIN (NITROSTAT) 0.4 MG SL tablet Place 0.4 mg under the tongue every 5 (five) minutes as needed.        . ramipril (ALTACE) 5 MG tablet Take 1 tablet (5 mg total) by mouth daily.  30 tablet  4  . rosuvastatin (CRESTOR) 20 MG tablet Take 20 mg by mouth daily.        Marland Kitchen thyroid (ARMOUR) 60 MG tablet Take 90 mg  by mouth daily.       . traZODone (DESYREL) 100 MG tablet Take 100 mg by mouth at bedtime.          Allergies  Allergen Reactions  . Darvocet (Propoxyphene N-Acetaminophen)   . Erythromycin     Past Medical History  Diagnosis Date  . Hypertension   . Coronary artery disease 12-2008    POST INFERIOR ST ELEVATION MYOCARDIAL INFARCTION  . MI (myocardial infarction) 12-2008  . Carotid arterial disease     RIGHT CAROTID BRUIT WITH 40-60% STENOSIS BILATERALLY  . Dyslipidemia   . Polysubstance abuse     WITH TOBACCO, ALCOHOL, AND CRACK COCAINE. NOW DRUG FREE FOR THE PAST YEAR  . Tobacco abuse     Past Surgical History  Procedure Date  . Coronary angioplasty 12/2008    RIGHT CORONARY   . Cervical fusion   . Removal of colon polyps     History  Smoking status  . Current Everyday Smoker -- 1.0 packs/day  . Types: Cigarettes  Smokeless tobacco  . Not on file    History  Alcohol Use No    Family History  Problem Relation Age of Onset  . Coronary artery  disease Mother   . Heart disease Mother     Review of Systems: As noted history of present illness.  All other systems were reviewed and are negative.  Physical Exam: BP 142/96  Pulse 99  Ht 5\' 7"  (1.702 m)  Wt 169 lb 1.9 oz (76.712 kg)  BMI 26.49 kg/m2 The patient is alert and oriented x 3.  The mood and affect are normal.  The skin is warm and dry.  Color is normal.  The HEENT exam reveals that the sclera are nonicteric.  The mucous membranes are moist.  The carotids are 2+ without bruits.  There is no thyromegaly.  There is no JVD.  The lungs are clear.  The chest wall is non tender.  The heart exam reveals a regular rate with a normal S1 and S2.  There are no murmurs, gallops, or rubs.  The PMI is not displaced.   Abdominal exam reveals good bowel sounds.  There is no guarding or rebound.  There is no hepatosplenomegaly or tenderness.  There are no masses.  Exam of the legs reveal no clubbing, cyanosis, or edema.  The  legs are without rashes.  The distal pulses are intact.  Cranial nerves II - XII are intact.  Motor and sensory functions are intact.  The gait is normal.  LABORATORY DATA: Nuclear stress testing demonstrated a partially reversible anteroseptal defect. Ejection fraction was normal.  Assessment / Plan:

## 2011-04-29 ENCOUNTER — Telehealth: Payer: Self-pay | Admitting: *Deleted

## 2011-04-29 NOTE — Telephone Encounter (Signed)
Message copied by Lorayne Bender on Thu Apr 29, 2011  8:47 AM ------      Message from: [place], PETER M      Created: Wed Apr 28, 2011  5:14 PM       Labs all ok for cath      Peter [place]

## 2011-04-29 NOTE — Telephone Encounter (Signed)
Notified of doppler results. Will send to Dr. Collins Scotland

## 2011-04-29 NOTE — Telephone Encounter (Signed)
Message copied by Lorayne Bender on Thu Apr 29, 2011  2:49 PM ------      Message from: [place], PETER M      Created: Thu Apr 29, 2011  1:45 PM       No significant carotid blockage.      Theron Arista [place]

## 2011-04-29 NOTE — Telephone Encounter (Signed)
Notified of lab and chest xray results. OK for cath tomorrow

## 2011-04-30 ENCOUNTER — Ambulatory Visit (HOSPITAL_COMMUNITY)
Admission: RE | Admit: 2011-04-30 | Discharge: 2011-04-30 | Disposition: A | Discharge status: Home / Self Care | Payer: BC Managed Care – PPO | Source: Ambulatory Visit | Attending: Cardiology | Admitting: Cardiology

## 2011-04-30 DIAGNOSIS — Z9861 Coronary angioplasty status: Secondary | ICD-10-CM | POA: Insufficient documentation

## 2011-04-30 DIAGNOSIS — F17200 Nicotine dependence, unspecified, uncomplicated: Secondary | ICD-10-CM | POA: Insufficient documentation

## 2011-04-30 DIAGNOSIS — I2510 Atherosclerotic heart disease of native coronary artery without angina pectoris: Secondary | ICD-10-CM | POA: Insufficient documentation

## 2011-04-30 DIAGNOSIS — I209 Angina pectoris, unspecified: Secondary | ICD-10-CM | POA: Insufficient documentation

## 2011-04-30 DIAGNOSIS — I252 Old myocardial infarction: Secondary | ICD-10-CM | POA: Insufficient documentation

## 2011-04-30 DIAGNOSIS — E119 Type 2 diabetes mellitus without complications: Secondary | ICD-10-CM | POA: Insufficient documentation

## 2011-04-30 LAB — GLUCOSE, CAPILLARY: Glucose-Capillary: 115 mg/dL — ABNORMAL HIGH (ref 70–99)

## 2011-05-03 NOTE — Cardiovascular Report (Signed)
  [name], STILLS NO.:  [id number]  MEDICAL RECORD NO.:  [id number]  LOCATION:  [facility]                         FACILITY:  [facility]  PHYSICIAN:  Virginio Isidore M. Reilly, M.D.  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  She presents now with recurrent angina. Lexiscan Myoview study demonstrates a partially reversible anterior septal defect.  PROCEDURE:  Left heart catheterization, coronary and left ventricular angiography.  Access via the right radial artery using standard Seldinger technique.  EQUIPMENT:  5-French 4 cm right Judkins catheter, 5-French 3 cm in EBU catheter, 5-French pigtail catheter, 5-French arterial sheath.  MEDICATIONS:  Local anesthesia, Xylocaine 1%, Versed 1 mg IV, fentanyl 25 mcg IV, verapamil 3 mg intra-arterial, heparin 3800 units IV.  HEMODYNAMIC DATA:  Aortic pressure is 145/77 with a mean of 106 mmHg, left ventricle pressure is 148 with EDP of 11 mmHg.  ANGIOGRAPHIC DATA:  The right coronary artery arises and distributes in a dominant fashion.  Has a 95% stenosis in the midvessel with a very long lesion.  There is also a 90% stenosis in the right ventricular marginal branch.  The left coronary arises and distributes normally. The left main coronary is normal.  The left anterior descending artery has a focal 80-90% stenosis in the proximal vessel.  There is diffuse 30% narrowing throughout the mid vessel.  The 1st diagonal is a moderately large vessel which has a 90% stenosis proximally with some haziness.  Left circumflex coronary has a 60% ostial stenosis followed by 30% disease in the midvessel. Left  ventricular angiography was performed in the RAO view.  This demonstrates normal left ventricular size and contractility with ejection fraction of 60%.  FINAL INTERPRETATION: 1. Severe three-vessel obstructive atherosclerotic coronary artery     disease. 2. Normal left ventricular function.  PLAN:  Given her complex multivessel disease with a high syntax score and the fact that she is diabetic, I would recommend consideration for revascularization with bypass surgery.  We will hold her Plavix in anticipation for this.          ______________________________ [name], M.D.     PMJ/MEDQ  D:  04/30/2011  T:  04/30/2011  Job:  [id number]  cc:   [name], M.D.  Electronically Signed by Destaney Sarkis Reilly M.D. on 05/03/2011 02:56:58 PM

## 2011-05-04 ENCOUNTER — Encounter: Payer: BC Managed Care – PPO | Admitting: Thoracic Surgery (Cardiothoracic Vascular Surgery)

## 2011-05-05 ENCOUNTER — Telehealth: Payer: Self-pay | Admitting: Cardiology

## 2011-05-05 ENCOUNTER — Encounter: Payer: BC Managed Care – PPO | Admitting: Thoracic Surgery (Cardiothoracic Vascular Surgery)

## 2011-05-05 NOTE — Telephone Encounter (Signed)
Called stating she missed her app yesterday w/surgeon because she didn't know anything about the app. She has an app w/Dr. Donata Clay on 10/31 and was wanting to know if could be sooner. Spoke w/Allison at [facility] and Dr. Donata Clay will see her tomorrow afternoon at 3:00. Advised pt of time and location.

## 2011-05-05 NOTE — Telephone Encounter (Signed)
Pt called. She has been scheduled with surgeon. She said she missed her appt yesterday please call her back

## 2011-05-06 ENCOUNTER — Other Ambulatory Visit: Payer: Self-pay

## 2011-05-06 ENCOUNTER — Encounter: Payer: Self-pay | Admitting: Cardiothoracic Surgery

## 2011-05-06 ENCOUNTER — Institutional Professional Consult (permissible substitution) (INDEPENDENT_AMBULATORY_CARE_PROVIDER_SITE_OTHER): Payer: BC Managed Care – PPO | Admitting: Cardiothoracic Surgery

## 2011-05-06 VITALS — BP 106/72 | HR 94 | Resp 20 | Ht 67.0 in | Wt 169.0 lb

## 2011-05-06 DIAGNOSIS — I2510 Atherosclerotic heart disease of native coronary artery without angina pectoris: Secondary | ICD-10-CM

## 2011-05-06 DIAGNOSIS — E119 Type 2 diabetes mellitus without complications: Secondary | ICD-10-CM

## 2011-05-06 NOTE — Patient Instructions (Addendum)
Stop plavix now before surgery. If you are  taking fish oil stop now. Continue aspirin as normal. Cut back smoking to lesss than 5 cigarettes a day.Stop glucophage after dose Sat 10--27. Take only Toprol on AM of surgery with sip of water.

## 2011-05-06 NOTE — Progress Notes (Signed)
PCP is Herb Grays, MD, MD Referring Provider is [place], George Alcantar, MD  Chief Complaint  Patient presents with  . Coronary Artery Disease    Referral from Dr Shan[place]ica Forker [place] for surgical eval, cardiac cath 04/30/11                     [address]            [place] [address]          [phone]      HPI:   The patient is a 55 year old Caucasian female hypertensive diabetic smoker who presents for treatment of recently diagnosed severe multivessel coronary artery disease. The patient had a PTCA of a high-grade RCA stenosis in 2010 when she presented with a non-stem he MI. She did well until recently when she began having chest heaviness with exertion relieved by rest. A stress test was positive for ischemia in the anterior apical region and so she underwent a cardiac catheterization via the right radial artery by Dr. Darl Pikes on 04/30/2011. This demonstrated a high-grade 95% restenosis of the right coronary which was a long lesion, high-grade proximal 95% stenosis of the LAD and 90% stenosis of a large first diagonal. Ejection fraction was about to 60% LVEDP was 11. The circumflex had a 60% ostial stenosis. Is felt patient would benefit from multivessel bypass grafting. She does not have any rest symptoms.  The patient denies a family history of a coronary disease. Risk factors include smoking, recent diagnosed diabetes, hypertension, and dyslipidemia.  Past Medical History  Diagnosis Date  . Hypertension   . Coronary artery disease 12-2008    POST INFERIOR ST ELEVATION MYOCARDIAL INFARCTION  . MI (myocardial infarction) 12-2008  . Carotid arterial disease     RIGHT CAROTID BRUIT WITH 40-60% STENOSIS BILATERALLY  . Dyslipidemia   . Polysubstance abuse     WITH TOBACCO, ALCOHOL, AND CRACK COCAINE. NOW DRUG FREE FOR THE PAST YEAR  . Tobacco abuse     Past Surgical History  Procedure Date  . Coronary angioplasty 12/2008    RIGHT CORONARY   . Cervical fusion   .  Removal of colon polyps   . Vaginal hysterectomy     Family History  Problem Relation Age of Onset  . Coronary artery disease Mother   . Heart disease Mother     Social History History  Substance Use Topics  . Smoking status: Current Everyday Smoker -- 1.0 packs/day    Types: Cigarettes  . Smokeless tobacco: Not on file  . Alcohol Use: No    Current Outpatient Prescriptions  Medication Sig Dispense Refill  . aspirin 81 MG tablet Take 81 mg by mouth daily.        . cloNIDine (CATAPRES) 0.1 MG tablet Take 0.1 mg by mouth daily.       . clopidogrel (PLAVIX) 75 MG tablet Take 1 tablet (75 mg total) by mouth daily.  30 tablet  5  . escitalopram (LEXAPRO) 10 MG tablet Take 10 mg by mouth daily.        . eszopiclone (LUNESTA) 2 MG TABS Take 2 mg by mouth as needed. Take immediately before bedtime       . HYDROcodone-acetaminophen (VICODIN) 5-500 MG per tablet Ad lib.      . isosorbide mononitrate (IMDUR) 60 MG 24 hr tablet Take 1 tablet (60 mg total) by mouth daily.  30 tablet  4  . levothyroxine (SYNTHROID, LEVOTHROID) 25 MCG tablet Take 25  mcg by mouth daily.        . metFORMIN (GLUCOPHAGE-XR) 500 MG 24 hr tablet Take 1 tablet by mouth Daily.      . metoprolol succinate (TOPROL-XL) 25 MG 24 hr tablet Take 1 tablet (25 mg total) by mouth daily.  30 tablet  4  . nitroGLYCERIN (NITROSTAT) 0.4 MG SL tablet Place 0.4 mg under the tongue every 5 (five) minutes as needed.        . ramipril (ALTACE) 5 MG tablet Take 1 tablet (5 mg total) by mouth daily.  30 tablet  4  . rosuvastatin (CRESTOR) 20 MG tablet Take 20 mg by mouth daily.        Marland Kitchen thyroid (ARMOUR) 60 MG tablet Take 90 mg by mouth daily.       . traZODone (DESYREL) 100 MG tablet Take 100 mg by mouth at bedtime.          Allergies  Allergen Reactions  . Darvocet (Propoxyphene N-Acetaminophen)   . Erythromycin     Review of Systems  The patient recently discontinued her Plavix after her cardiac catheter. She denies any fever  or weight loss or night sweats. She denies a productive cough. ENT review is negative for dental complaints. A sleep study in the past showed no evidence of obstructive sleep apnea. She denies difficulty swallowing. THORACIC REVIEW negative for history thoracic trauma or recent symptoms of upper respiratory infection, negative for pneumothorax or hemoptysis. She smokes one pack of cigarettes a day but plans eye cutting back to less than 5 cigarettes a day before surgery. CARDIAC REVIEW positive for angina negative her history of cardiac murmur negative her history of cardiac valve disease negative for history of arrhythmia. GI negative for history of jaundice gallstones blood per rectum hepatitis. ENDOCRINE positive for diabetes recently diagnosed positive for thyroid disease-VASCULAR positive for mild-to-moderate bilateral carotid disease 40-6% by carotid duplex and 2010 negative for TIA negative for DVT negative for varicose veins negative for claudication HEMATOLOGIC she recently stopped her Plavix approximately one week ago. She denies any bleeding difficulties with Plavix. Her P2Y 12 status is unknown. NEUROLOGIC no history of strokes seizure syncope or concussion. She is right-hand dominant.  BP 106/72  Pulse 94  Resp 20  Ht 5\' 7"  (1.702 m)  Wt 169 lb (76.658 kg)  BMI 26.47 kg/m2  SpO2 94% Physical Exam  General appearance is that of an. middle-aged Caucasian female accompanied by her husband in no acute distress. HEENT exam normocephalic condition adequate pupils equal. NECK supple without JVD mass or carotid bruit LYMPHATICS no palpable adenopathy the neck or super clavicular fossa. THORAX no deformity no tenderness scattered rhonchi bilaterally CARDIAC regular rate and rhythm without S3 gallop murmur or rub GI no tenderness mass or organomegaly. EXTREMITIES no clubbing cyanosis edema or tenderness VASCULAR palpable pulses in all extremities no evidence of venous insufficiency of the lower  extremities. NEUROLOGIC alert and oriented no focal motor deficit.  Diagnostic Tests: I reviewed the results of the coronary. She has a long high-grade 95-99% stenosis the right coronary extending past the RV marginal and to the distal right. She is a high-grade 95% stenosis of the LAD. She is a high-grade 90% stenosis of the first title. She has a moderate 60% stenosis of the origin of the circumflex a. His normal. LVEDP is normal. Her most recent chest x-ray shows no acute disease.  Impression: Severe multivessel coronary disease with preserved LV function with increasing angina.   Plan: Multivessel bypass  grafting planned on October 29 with bypass grafts planned tothe LAD, diagonal, RCA, circumflex marginal. I have discussed indications benefits risks and alternatives of surgery to the patient and she understands and agrees to proceed.

## 2011-05-07 ENCOUNTER — Ambulatory Visit (HOSPITAL_COMMUNITY): Payer: BC Managed Care – PPO

## 2011-05-07 ENCOUNTER — Ambulatory Visit (HOSPITAL_COMMUNITY)
Admission: RE | Admit: 2011-05-07 | Discharge: 2011-05-07 | Disposition: A | Discharge status: Home / Self Care | Payer: BC Managed Care – PPO | Source: Ambulatory Visit | Attending: Cardiothoracic Surgery | Admitting: Cardiothoracic Surgery

## 2011-05-07 ENCOUNTER — Encounter (HOSPITAL_COMMUNITY)
Admission: RE | Admit: 2011-05-07 | Discharge: 2011-05-07 | Disposition: A | Discharge status: Home / Self Care | Payer: BC Managed Care – PPO | Source: Ambulatory Visit | Attending: Cardiothoracic Surgery | Admitting: Cardiothoracic Surgery

## 2011-05-07 ENCOUNTER — Encounter: Payer: Self-pay | Admitting: *Deleted

## 2011-05-07 DIAGNOSIS — E785 Hyperlipidemia, unspecified: Secondary | ICD-10-CM | POA: Insufficient documentation

## 2011-05-07 DIAGNOSIS — I6529 Occlusion and stenosis of unspecified carotid artery: Secondary | ICD-10-CM | POA: Insufficient documentation

## 2011-05-07 DIAGNOSIS — Z01810 Encounter for preprocedural cardiovascular examination: Secondary | ICD-10-CM | POA: Insufficient documentation

## 2011-05-07 DIAGNOSIS — I10 Essential (primary) hypertension: Secondary | ICD-10-CM | POA: Insufficient documentation

## 2011-05-07 DIAGNOSIS — Z01812 Encounter for preprocedural laboratory examination: Secondary | ICD-10-CM | POA: Insufficient documentation

## 2011-05-07 DIAGNOSIS — E119 Type 2 diabetes mellitus without complications: Secondary | ICD-10-CM | POA: Insufficient documentation

## 2011-05-07 DIAGNOSIS — I2510 Atherosclerotic heart disease of native coronary artery without angina pectoris: Secondary | ICD-10-CM

## 2011-05-07 LAB — PULMONARY FUNCTION TEST

## 2011-05-07 LAB — HEMOGLOBIN A1C
Hgb A1c MFr Bld: 6.5 % — ABNORMAL HIGH (ref <5.7)
Mean Plasma Glucose: 140 mg/dL — ABNORMAL HIGH (ref <117)

## 2011-05-07 LAB — PROTIME-INR
INR: 0.9 (ref 0.00–1.49)
Prothrombin Time: 12.3 seconds (ref 11.6–15.2)

## 2011-05-07 LAB — BLOOD GAS, ARTERIAL
Acid-base deficit: 2.7 mmol/L — ABNORMAL HIGH (ref 0.0–2.0)
Bicarbonate: 21 mEq/L (ref 20.0–24.0)
Drawn by: 181601
FIO2: 0.21 %
O2 Saturation: 98.5 %
Patient temperature: 98.6
TCO2: 22 mmol/L (ref 0–100)
pCO2 arterial: 32.1 mmHg — ABNORMAL LOW (ref 35.0–45.0)
pH, Arterial: 7.43 — ABNORMAL HIGH (ref 7.350–7.400)
pO2, Arterial: 113 mmHg — ABNORMAL HIGH (ref 80.0–100.0)

## 2011-05-07 LAB — COMPREHENSIVE METABOLIC PANEL
ALT: 19 U/L (ref 0–35)
AST: 17 U/L (ref 0–37)
Albumin: 4.3 g/dL (ref 3.5–5.2)
Alkaline Phosphatase: 81 U/L (ref 39–117)
BUN: 22 mg/dL (ref 6–23)
CO2: 19 mEq/L (ref 19–32)
Calcium: 10.1 mg/dL (ref 8.4–10.5)
Chloride: 107 mEq/L (ref 96–112)
Creatinine, Ser: 0.58 mg/dL (ref 0.50–1.10)
GFR calc Af Amer: >90 mL/min (ref >90)
GFR calc non Af Amer: >90 mL/min (ref >90)
Glucose, Bld: 99 mg/dL (ref 70–99)
Potassium: 4.4 mEq/L (ref 3.5–5.1)
Sodium: 140 mEq/L (ref 135–145)
Total Bilirubin: 0.3 mg/dL (ref 0.3–1.2)
Total Protein: 7.3 g/dL (ref 6.0–8.3)

## 2011-05-07 LAB — CBC
HCT: 45.8 % (ref 36.0–46.0)
Hemoglobin: 16.1 g/dL — ABNORMAL HIGH (ref 12.0–15.0)
MCH: 34.2 pg — ABNORMAL HIGH (ref 26.0–34.0)
MCHC: 35.2 g/dL (ref 30.0–36.0)
MCV: 97.2 fL (ref 78.0–100.0)
Platelets: 263 10*3/uL (ref 150–400)
RBC: 4.71 MIL/uL (ref 3.87–5.11)
RDW: 13.7 % (ref 11.5–15.5)
WBC: 9.5 10*3/uL (ref 4.0–10.5)

## 2011-05-07 LAB — URINALYSIS, ROUTINE W REFLEX MICROSCOPIC
Glucose, UA: NEGATIVE mg/dL
Hgb urine dipstick: NEGATIVE
Ketones, ur: NEGATIVE mg/dL
Leukocytes, UA: NEGATIVE
Nitrite: NEGATIVE
Protein, ur: NEGATIVE mg/dL
Specific Gravity, Urine: 1.046 — ABNORMAL HIGH (ref 1.005–1.030)
Urobilinogen, UA: 0.2 mg/dL (ref 0.0–1.0)
pH: 5 (ref 5.0–8.0)

## 2011-05-07 LAB — SURGICAL PCR SCREEN
MRSA, PCR: NEGATIVE
Staphylococcus aureus: NEGATIVE

## 2011-05-07 LAB — APTT: aPTT: 31 seconds (ref 24–37)

## 2011-05-10 ENCOUNTER — Other Ambulatory Visit: Payer: Self-pay | Admitting: Cardiothoracic Surgery

## 2011-05-10 ENCOUNTER — Inpatient Hospital Stay (HOSPITAL_COMMUNITY)
Admission: RE | Admit: 2011-05-10 | Discharge: 2011-05-15 | DRG: 109 | Disposition: A | Discharge status: Home / Self Care | Payer: BC Managed Care – PPO | Source: Ambulatory Visit | Attending: Cardiothoracic Surgery | Admitting: Cardiothoracic Surgery

## 2011-05-10 ENCOUNTER — Inpatient Hospital Stay (HOSPITAL_COMMUNITY): Payer: BC Managed Care – PPO

## 2011-05-10 DIAGNOSIS — I6529 Occlusion and stenosis of unspecified carotid artery: Secondary | ICD-10-CM | POA: Diagnosis present

## 2011-05-10 DIAGNOSIS — F329 Major depressive disorder, single episode, unspecified: Secondary | ICD-10-CM | POA: Diagnosis present

## 2011-05-10 DIAGNOSIS — J4489 Other specified chronic obstructive pulmonary disease: Secondary | ICD-10-CM | POA: Diagnosis present

## 2011-05-10 DIAGNOSIS — Z981 Arthrodesis status: Secondary | ICD-10-CM

## 2011-05-10 DIAGNOSIS — E119 Type 2 diabetes mellitus without complications: Secondary | ICD-10-CM | POA: Diagnosis present

## 2011-05-10 DIAGNOSIS — E785 Hyperlipidemia, unspecified: Secondary | ICD-10-CM | POA: Diagnosis present

## 2011-05-10 DIAGNOSIS — Z9861 Coronary angioplasty status: Secondary | ICD-10-CM

## 2011-05-10 DIAGNOSIS — D62 Acute posthemorrhagic anemia: Secondary | ICD-10-CM | POA: Diagnosis not present

## 2011-05-10 DIAGNOSIS — I252 Old myocardial infarction: Secondary | ICD-10-CM

## 2011-05-10 DIAGNOSIS — F17200 Nicotine dependence, unspecified, uncomplicated: Secondary | ICD-10-CM | POA: Diagnosis present

## 2011-05-10 DIAGNOSIS — I4891 Unspecified atrial fibrillation: Secondary | ICD-10-CM | POA: Diagnosis not present

## 2011-05-10 DIAGNOSIS — Z23 Encounter for immunization: Secondary | ICD-10-CM

## 2011-05-10 DIAGNOSIS — Z79899 Other long term (current) drug therapy: Secondary | ICD-10-CM

## 2011-05-10 DIAGNOSIS — I10 Essential (primary) hypertension: Secondary | ICD-10-CM | POA: Diagnosis present

## 2011-05-10 DIAGNOSIS — I2510 Atherosclerotic heart disease of native coronary artery without angina pectoris: Secondary | ICD-10-CM

## 2011-05-10 DIAGNOSIS — F3289 Other specified depressive episodes: Secondary | ICD-10-CM | POA: Diagnosis present

## 2011-05-10 DIAGNOSIS — Z7982 Long term (current) use of aspirin: Secondary | ICD-10-CM

## 2011-05-10 DIAGNOSIS — Z7902 Long term (current) use of antithrombotics/antiplatelets: Secondary | ICD-10-CM

## 2011-05-10 DIAGNOSIS — G458 Other transient cerebral ischemic attacks and related syndromes: Secondary | ICD-10-CM | POA: Diagnosis present

## 2011-05-10 DIAGNOSIS — E8779 Other fluid overload: Secondary | ICD-10-CM | POA: Diagnosis not present

## 2011-05-10 DIAGNOSIS — J449 Chronic obstructive pulmonary disease, unspecified: Secondary | ICD-10-CM | POA: Diagnosis present

## 2011-05-10 DIAGNOSIS — I658 Occlusion and stenosis of other precerebral arteries: Secondary | ICD-10-CM | POA: Diagnosis present

## 2011-05-10 DIAGNOSIS — I200 Unstable angina: Secondary | ICD-10-CM | POA: Diagnosis present

## 2011-05-10 HISTORY — PX: OTHER SURGICAL HISTORY: SHX169

## 2011-05-10 LAB — POCT I-STAT 4, (NA,K, GLUC, HGB,HCT)
Glucose, Bld: 116 mg/dL — ABNORMAL HIGH (ref 70–99)
Glucose, Bld: 123 mg/dL — ABNORMAL HIGH (ref 70–99)
Glucose, Bld: 140 mg/dL — ABNORMAL HIGH (ref 70–99)
Glucose, Bld: 138 mg/dL — ABNORMAL HIGH (ref 70–99)
Glucose, Bld: 138 mg/dL — ABNORMAL HIGH (ref 70–99)
Glucose, Bld: 142 mg/dL — ABNORMAL HIGH (ref 70–99)
Glucose, Bld: 132 mg/dL — ABNORMAL HIGH (ref 70–99)
Glucose, Bld: 131 mg/dL — ABNORMAL HIGH (ref 70–99)
Glucose, Bld: 101 mg/dL — ABNORMAL HIGH (ref 70–99)
HCT: 39 % (ref 36.0–46.0)
HCT: 26 % — ABNORMAL LOW (ref 36.0–46.0)
HCT: 37 % (ref 36.0–46.0)
HCT: 22 % — ABNORMAL LOW (ref 36.0–46.0)
HCT: 24 % — ABNORMAL LOW (ref 36.0–46.0)
HCT: 27 % — ABNORMAL LOW (ref 36.0–46.0)
HCT: 21 % — ABNORMAL LOW (ref 36.0–46.0)
HCT: 20 % — ABNORMAL LOW (ref 36.0–46.0)
HCT: 27 % — ABNORMAL LOW (ref 36.0–46.0)
Hemoglobin: 13.3 g/dL (ref 12.0–15.0)
Hemoglobin: 12.6 g/dL (ref 12.0–15.0)
Hemoglobin: 8.8 g/dL — ABNORMAL LOW (ref 12.0–15.0)
Hemoglobin: 7.5 g/dL — ABNORMAL LOW (ref 12.0–15.0)
Hemoglobin: 8.2 g/dL — ABNORMAL LOW (ref 12.0–15.0)
Hemoglobin: 9.2 g/dL — ABNORMAL LOW (ref 12.0–15.0)
Hemoglobin: 7.1 g/dL — ABNORMAL LOW (ref 12.0–15.0)
Hemoglobin: 6.8 g/dL — CL (ref 12.0–15.0)
Hemoglobin: 9.2 g/dL — ABNORMAL LOW (ref 12.0–15.0)
Potassium: 3.9 mEq/L (ref 3.5–5.1)
Potassium: 3.6 mEq/L (ref 3.5–5.1)
Potassium: 3.7 mEq/L (ref 3.5–5.1)
Potassium: 4.1 mEq/L (ref 3.5–5.1)
Potassium: 4.6 mEq/L (ref 3.5–5.1)
Potassium: 3.9 mEq/L (ref 3.5–5.1)
Potassium: 4.3 mEq/L (ref 3.5–5.1)
Potassium: 3.3 mEq/L — ABNORMAL LOW (ref 3.5–5.1)
Potassium: 3.6 mEq/L (ref 3.5–5.1)
Sodium: 143 mEq/L (ref 135–145)
Sodium: 136 mEq/L (ref 135–145)
Sodium: 142 mEq/L (ref 135–145)
Sodium: 137 mEq/L (ref 135–145)
Sodium: 138 mEq/L (ref 135–145)
Sodium: 141 mEq/L (ref 135–145)
Sodium: 143 mEq/L (ref 135–145)
Sodium: 142 mEq/L (ref 135–145)
Sodium: 142 mEq/L (ref 135–145)

## 2011-05-10 LAB — POCT I-STAT 3, ART BLOOD GAS (G3+)
Acid-Base Excess: 1 mmol/L (ref 0.0–2.0)
Acid-Base Excess: 1 mmol/L (ref 0.0–2.0)
Acid-Base Excess: 2 mmol/L (ref 0.0–2.0)
Acid-base deficit: 3 mmol/L — ABNORMAL HIGH (ref 0.0–2.0)
Bicarbonate: 25.9 mEq/L — ABNORMAL HIGH (ref 20.0–24.0)
Bicarbonate: 23.1 mEq/L (ref 20.0–24.0)
Bicarbonate: 25.7 mEq/L — ABNORMAL HIGH (ref 20.0–24.0)
Bicarbonate: 27.1 mEq/L — ABNORMAL HIGH (ref 20.0–24.0)
Bicarbonate: 25.4 mEq/L — ABNORMAL HIGH (ref 20.0–24.0)
O2 Saturation: 100 %
O2 Saturation: 99 %
O2 Saturation: 97 %
O2 Saturation: 97 %
O2 Saturation: 96 %
Patient temperature: 35.5
Patient temperature: 99
Patient temperature: 32.2
TCO2: 27 mmol/L (ref 0–100)
TCO2: 24 mmol/L (ref 0–100)
TCO2: 27 mmol/L (ref 0–100)
TCO2: 28 mmol/L (ref 0–100)
TCO2: 27 mmol/L (ref 0–100)
pCO2 arterial: 44.1 mmHg (ref 35.0–45.0)
pCO2 arterial: 45.1 mmHg — ABNORMAL HIGH (ref 35.0–45.0)
pCO2 arterial: 40.4 mmHg (ref 35.0–45.0)
pCO2 arterial: 44.1 mmHg (ref 35.0–45.0)
pCO2 arterial: 34 mmHg — ABNORMAL LOW (ref 35.0–45.0)
pH, Arterial: 7.377 (ref 7.350–7.400)
pH, Arterial: 7.317 — ABNORMAL LOW (ref 7.350–7.400)
pH, Arterial: 7.405 — ABNORMAL HIGH (ref 7.350–7.400)
pH, Arterial: 7.397 (ref 7.350–7.400)
pH, Arterial: 7.46 — ABNORMAL HIGH (ref 7.350–7.400)
pO2, Arterial: 415 mmHg — ABNORMAL HIGH (ref 80.0–100.0)
pO2, Arterial: 153 mmHg — ABNORMAL HIGH (ref 80.0–100.0)
pO2, Arterial: 87 mmHg (ref 80.0–100.0)
pO2, Arterial: 90 mmHg (ref 80.0–100.0)
pO2, Arterial: 62 mmHg — ABNORMAL LOW (ref 80.0–100.0)

## 2011-05-10 LAB — GLUCOSE, CAPILLARY: Glucose-Capillary: 126 mg/dL — ABNORMAL HIGH (ref 70–99)

## 2011-05-10 LAB — CBC
HCT: 27.6 % — ABNORMAL LOW (ref 36.0–46.0)
HCT: 27.2 % — ABNORMAL LOW (ref 36.0–46.0)
Hemoglobin: 9.5 g/dL — ABNORMAL LOW (ref 12.0–15.0)
Hemoglobin: 9.3 g/dL — ABNORMAL LOW (ref 12.0–15.0)
MCH: 33 pg (ref 26.0–34.0)
MCH: 32.9 pg (ref 26.0–34.0)
MCHC: 34.4 g/dL (ref 30.0–36.0)
MCHC: 34.2 g/dL (ref 30.0–36.0)
MCV: 95.8 fL (ref 78.0–100.0)
MCV: 96.1 fL (ref 78.0–100.0)
Platelets: 161 10*3/uL (ref 150–400)
Platelets: 164 10*3/uL (ref 150–400)
RBC: 2.88 MIL/uL — ABNORMAL LOW (ref 3.87–5.11)
RBC: 2.83 MIL/uL — ABNORMAL LOW (ref 3.87–5.11)
RDW: 13.5 % (ref 11.5–15.5)
RDW: 13.9 % (ref 11.5–15.5)
WBC: 13 10*3/uL — ABNORMAL HIGH (ref 4.0–10.5)
WBC: 14.6 10*3/uL — ABNORMAL HIGH (ref 4.0–10.5)

## 2011-05-10 LAB — HEMOGLOBIN AND HEMATOCRIT, BLOOD
HCT: 26.1 % — ABNORMAL LOW (ref 36.0–46.0)
HCT: 21.8 % — ABNORMAL LOW (ref 36.0–46.0)
Hemoglobin: 8.8 g/dL — ABNORMAL LOW (ref 12.0–15.0)
Hemoglobin: 7.4 g/dL — ABNORMAL LOW (ref 12.0–15.0)

## 2011-05-10 LAB — CREATININE, SERUM
Creatinine, Ser: 0.41 mg/dL — ABNORMAL LOW (ref 0.50–1.10)
GFR calc Af Amer: >90 mL/min (ref >90)
GFR calc non Af Amer: >90 mL/min (ref >90)

## 2011-05-10 LAB — POCT I-STAT, CHEM 8
BUN: 6 mg/dL (ref 6–23)
Calcium, Ion: 1 mmol/L — ABNORMAL LOW (ref 1.12–1.32)
Chloride: 111 mEq/L (ref 96–112)
Creatinine, Ser: 0.3 mg/dL — ABNORMAL LOW (ref 0.50–1.10)
Glucose, Bld: 89 mg/dL (ref 70–99)
HCT: 24 % — ABNORMAL LOW (ref 36.0–46.0)
Hemoglobin: 8.2 g/dL — ABNORMAL LOW (ref 12.0–15.0)
Potassium: 3.2 mEq/L — ABNORMAL LOW (ref 3.5–5.1)
Sodium: 147 mEq/L — ABNORMAL HIGH (ref 135–145)
TCO2: 17 mmol/L (ref 0–100)

## 2011-05-10 LAB — PROTIME-INR
INR: 1.54 — ABNORMAL HIGH (ref 0.00–1.49)
Prothrombin Time: 18.8 seconds — ABNORMAL HIGH (ref 11.6–15.2)

## 2011-05-10 LAB — APTT: aPTT: 37 seconds (ref 24–37)

## 2011-05-10 LAB — ABO/RH: ABO/RH(D): O POS

## 2011-05-10 LAB — TYPE AND SCREEN
ABO/RH(D): O POS
Antibody Screen: NEGATIVE

## 2011-05-10 LAB — PLATELET COUNT
Platelets: 149 10*3/uL — ABNORMAL LOW (ref 150–400)
Platelets: 124 10*3/uL — ABNORMAL LOW (ref 150–400)

## 2011-05-10 LAB — MAGNESIUM: Magnesium: 1.7 mg/dL (ref 1.5–2.5)

## 2011-05-11 ENCOUNTER — Inpatient Hospital Stay (HOSPITAL_COMMUNITY): Payer: BC Managed Care – PPO

## 2011-05-11 LAB — BASIC METABOLIC PANEL
BUN: 9 mg/dL (ref 6–23)
CO2: 26 mEq/L (ref 19–32)
Calcium: 8.5 mg/dL (ref 8.4–10.5)
Chloride: 105 mEq/L (ref 96–112)
Creatinine, Ser: 0.57 mg/dL (ref 0.50–1.10)
GFR calc Af Amer: >90 mL/min (ref >90)
GFR calc non Af Amer: >90 mL/min (ref >90)
Glucose, Bld: 100 mg/dL — ABNORMAL HIGH (ref 70–99)
Potassium: 4.3 mEq/L (ref 3.5–5.1)
Sodium: 137 mEq/L (ref 135–145)

## 2011-05-11 LAB — CREATININE, SERUM
Creatinine, Ser: 0.54 mg/dL (ref 0.50–1.10)
GFR calc Af Amer: >90 mL/min (ref >90)
GFR calc non Af Amer: >90 mL/min (ref >90)

## 2011-05-11 LAB — POCT I-STAT, CHEM 8
BUN: 12 mg/dL (ref 6–23)
Calcium, Ion: 1.25 mmol/L (ref 1.12–1.32)
Chloride: 97 mEq/L (ref 96–112)
Creatinine, Ser: 0.6 mg/dL (ref 0.50–1.10)
Glucose, Bld: 140 mg/dL — ABNORMAL HIGH (ref 70–99)
HCT: 34 % — ABNORMAL LOW (ref 36.0–46.0)
Hemoglobin: 11.6 g/dL — ABNORMAL LOW (ref 12.0–15.0)
Potassium: 4.1 mEq/L (ref 3.5–5.1)
Sodium: 138 mEq/L (ref 135–145)
TCO2: 26 mmol/L (ref 0–100)

## 2011-05-11 LAB — GLUCOSE, CAPILLARY
Glucose-Capillary: 107 mg/dL — ABNORMAL HIGH (ref 70–99)
Glucose-Capillary: 110 mg/dL — ABNORMAL HIGH (ref 70–99)
Glucose-Capillary: 111 mg/dL — ABNORMAL HIGH (ref 70–99)
Glucose-Capillary: 113 mg/dL — ABNORMAL HIGH (ref 70–99)
Glucose-Capillary: 115 mg/dL — ABNORMAL HIGH (ref 70–99)
Glucose-Capillary: 115 mg/dL — ABNORMAL HIGH (ref 70–99)
Glucose-Capillary: 118 mg/dL — ABNORMAL HIGH (ref 70–99)
Glucose-Capillary: 126 mg/dL — ABNORMAL HIGH (ref 70–99)
Glucose-Capillary: 129 mg/dL — ABNORMAL HIGH (ref 70–99)
Glucose-Capillary: 131 mg/dL — ABNORMAL HIGH (ref 70–99)
Glucose-Capillary: 137 mg/dL — ABNORMAL HIGH (ref 70–99)
Glucose-Capillary: 141 mg/dL — ABNORMAL HIGH (ref 70–99)
Glucose-Capillary: 155 mg/dL — ABNORMAL HIGH (ref 70–99)
Glucose-Capillary: 93 mg/dL (ref 70–99)
Glucose-Capillary: 97 mg/dL (ref 70–99)
Glucose-Capillary: 104 mg/dL — ABNORMAL HIGH (ref 70–99)
Glucose-Capillary: 113 mg/dL — ABNORMAL HIGH (ref 70–99)
Glucose-Capillary: 126 mg/dL — ABNORMAL HIGH (ref 70–99)
Glucose-Capillary: 131 mg/dL — ABNORMAL HIGH (ref 70–99)
Glucose-Capillary: 141 mg/dL — ABNORMAL HIGH (ref 70–99)
Glucose-Capillary: 156 mg/dL — ABNORMAL HIGH (ref 70–99)
Glucose-Capillary: 167 mg/dL — ABNORMAL HIGH (ref 70–99)

## 2011-05-11 LAB — CBC
HCT: 31.9 % — ABNORMAL LOW (ref 36.0–46.0)
HCT: 33.8 % — ABNORMAL LOW (ref 36.0–46.0)
Hemoglobin: 10.7 g/dL — ABNORMAL LOW (ref 12.0–15.0)
Hemoglobin: 11.2 g/dL — ABNORMAL LOW (ref 12.0–15.0)
MCH: 32.4 pg (ref 26.0–34.0)
MCH: 33.1 pg (ref 26.0–34.0)
MCHC: 33.5 g/dL (ref 30.0–36.0)
MCHC: 33.1 g/dL (ref 30.0–36.0)
MCV: 96.7 fL (ref 78.0–100.0)
MCV: 100 fL (ref 78.0–100.0)
Platelets: 191 10*3/uL (ref 150–400)
Platelets: 186 10*3/uL (ref 150–400)
RBC: 3.3 MIL/uL — ABNORMAL LOW (ref 3.87–5.11)
RBC: 3.38 MIL/uL — ABNORMAL LOW (ref 3.87–5.11)
RDW: 13.9 % (ref 11.5–15.5)
RDW: 14.2 % (ref 11.5–15.5)
WBC: 16.2 10*3/uL — ABNORMAL HIGH (ref 4.0–10.5)
WBC: 20.6 10*3/uL — ABNORMAL HIGH (ref 4.0–10.5)

## 2011-05-11 LAB — PREPARE FRESH FROZEN PLASMA: Unit division: 0

## 2011-05-11 LAB — MAGNESIUM
Magnesium: 2.1 mg/dL (ref 1.5–2.5)
Magnesium: 1.7 mg/dL (ref 1.5–2.5)

## 2011-05-11 LAB — PREPARE PLATELET PHERESIS: Unit division: 0

## 2011-05-12 ENCOUNTER — Encounter: Payer: BC Managed Care – PPO | Admitting: Cardiothoracic Surgery

## 2011-05-12 ENCOUNTER — Inpatient Hospital Stay (HOSPITAL_COMMUNITY): Payer: BC Managed Care – PPO

## 2011-05-12 DIAGNOSIS — IMO0001 Reserved for inherently not codable concepts without codable children: Secondary | ICD-10-CM

## 2011-05-12 DIAGNOSIS — E1165 Type 2 diabetes mellitus with hyperglycemia: Secondary | ICD-10-CM

## 2011-05-12 LAB — GLUCOSE, CAPILLARY
Glucose-Capillary: 153 mg/dL — ABNORMAL HIGH (ref 70–99)
Glucose-Capillary: 168 mg/dL — ABNORMAL HIGH (ref 70–99)
Glucose-Capillary: 143 mg/dL — ABNORMAL HIGH (ref 70–99)
Glucose-Capillary: 120 mg/dL — ABNORMAL HIGH (ref 70–99)
Glucose-Capillary: 137 mg/dL — ABNORMAL HIGH (ref 70–99)

## 2011-05-12 LAB — BASIC METABOLIC PANEL
CO2: 31 mEq/L (ref 19–32)
Calcium: 9.4 mg/dL (ref 8.4–10.5)
GFR calc Af Amer: >90 mL/min (ref >90)
Sodium: 137 mEq/L (ref 135–145)

## 2011-05-12 LAB — CBC
HCT: 32.6 % — ABNORMAL LOW (ref 36.0–46.0)
Hemoglobin: 10.5 g/dL — ABNORMAL LOW (ref 12.0–15.0)
RDW: 14.2 % (ref 11.5–15.5)
WBC: 17.9 10*3/uL — ABNORMAL HIGH (ref 4.0–10.5)

## 2011-05-13 ENCOUNTER — Inpatient Hospital Stay (HOSPITAL_COMMUNITY): Payer: BC Managed Care – PPO

## 2011-05-13 DIAGNOSIS — IMO0001 Reserved for inherently not codable concepts without codable children: Secondary | ICD-10-CM

## 2011-05-13 DIAGNOSIS — E1165 Type 2 diabetes mellitus with hyperglycemia: Secondary | ICD-10-CM

## 2011-05-13 LAB — COMPREHENSIVE METABOLIC PANEL
ALT: 45 U/L — ABNORMAL HIGH (ref 0–35)
AST: 37 U/L (ref 0–37)
Albumin: 2.7 g/dL — ABNORMAL LOW (ref 3.5–5.2)
Alkaline Phosphatase: 48 U/L (ref 39–117)
BUN: 12 mg/dL (ref 6–23)
CO2: 33 mEq/L — ABNORMAL HIGH (ref 19–32)
Calcium: 8.8 mg/dL (ref 8.4–10.5)
Chloride: 101 mEq/L (ref 96–112)
Creatinine, Ser: 0.58 mg/dL (ref 0.50–1.10)
GFR calc Af Amer: >90 mL/min (ref >90)
GFR calc non Af Amer: >90 mL/min (ref >90)
Glucose, Bld: 114 mg/dL — ABNORMAL HIGH (ref 70–99)
Potassium: 4.1 mEq/L (ref 3.5–5.1)
Sodium: 140 mEq/L (ref 135–145)
Total Bilirubin: 0.4 mg/dL (ref 0.3–1.2)
Total Protein: 5.9 g/dL — ABNORMAL LOW (ref 6.0–8.3)

## 2011-05-13 LAB — CBC
HCT: 29.9 % — ABNORMAL LOW (ref 36.0–46.0)
Hemoglobin: 9.7 g/dL — ABNORMAL LOW (ref 12.0–15.0)
MCH: 32.3 pg (ref 26.0–34.0)
MCHC: 32.4 g/dL (ref 30.0–36.0)
MCV: 99.7 fL (ref 78.0–100.0)
Platelets: 143 10*3/uL — ABNORMAL LOW (ref 150–400)
RBC: 3 MIL/uL — ABNORMAL LOW (ref 3.87–5.11)
RDW: 14.1 % (ref 11.5–15.5)
WBC: 14.7 10*3/uL — ABNORMAL HIGH (ref 4.0–10.5)

## 2011-05-13 LAB — GLUCOSE, CAPILLARY
Glucose-Capillary: 106 mg/dL — ABNORMAL HIGH (ref 70–99)
Glucose-Capillary: 95 mg/dL (ref 70–99)
Glucose-Capillary: 115 mg/dL — ABNORMAL HIGH (ref 70–99)
Glucose-Capillary: 102 mg/dL — ABNORMAL HIGH (ref 70–99)
Glucose-Capillary: 159 mg/dL — ABNORMAL HIGH (ref 70–99)
Glucose-Capillary: 144 mg/dL — ABNORMAL HIGH (ref 70–99)

## 2011-05-13 LAB — TSH: TSH: 4.723 u[IU]/mL — ABNORMAL HIGH (ref 0.350–4.500)

## 2011-05-13 LAB — PROTIME-INR
INR: 1.18 (ref 0.00–1.49)
Prothrombin Time: 15.2 seconds (ref 11.6–15.2)

## 2011-05-14 ENCOUNTER — Inpatient Hospital Stay (HOSPITAL_COMMUNITY): Payer: BC Managed Care – PPO

## 2011-05-14 LAB — GLUCOSE, CAPILLARY
Glucose-Capillary: 93 mg/dL (ref 70–99)
Glucose-Capillary: 75 mg/dL (ref 70–99)
Glucose-Capillary: 119 mg/dL — ABNORMAL HIGH (ref 70–99)
Glucose-Capillary: 127 mg/dL — ABNORMAL HIGH (ref 70–99)

## 2011-05-14 LAB — CBC
HCT: 29.4 % — ABNORMAL LOW (ref 36.0–46.0)
Hemoglobin: 9.5 g/dL — ABNORMAL LOW (ref 12.0–15.0)
MCH: 32.6 pg (ref 26.0–34.0)
MCHC: 32.3 g/dL (ref 30.0–36.0)
MCV: 101 fL — ABNORMAL HIGH (ref 78.0–100.0)
Platelets: 186 10*3/uL (ref 150–400)
RBC: 2.91 MIL/uL — ABNORMAL LOW (ref 3.87–5.11)
RDW: 14.1 % (ref 11.5–15.5)
WBC: 9.7 10*3/uL (ref 4.0–10.5)

## 2011-05-14 LAB — BASIC METABOLIC PANEL
BUN: 16 mg/dL (ref 6–23)
CO2: 31 mEq/L (ref 19–32)
Calcium: 8.7 mg/dL (ref 8.4–10.5)
Chloride: 100 mEq/L (ref 96–112)
Creatinine, Ser: 0.63 mg/dL (ref 0.50–1.10)
GFR calc Af Amer: >90 mL/min (ref >90)
GFR calc non Af Amer: >90 mL/min (ref >90)
Glucose, Bld: 99 mg/dL (ref 70–99)
Potassium: 3.8 mEq/L (ref 3.5–5.1)
Sodium: 139 mEq/L (ref 135–145)

## 2011-05-15 LAB — GLUCOSE, CAPILLARY: Glucose-Capillary: 151 mg/dL — ABNORMAL HIGH (ref 70–99)

## 2011-05-15 MED ORDER — PANTOPRAZOLE SODIUM 40 MG PO TBEC: 40.0000 mg | DELAYED_RELEASE_TABLET | Freq: Every day | ORAL | Status: DC

## 2011-05-15 MED ORDER — METOPROLOL TARTRATE 25 MG/10 ML ORAL SUSPENSION
12.5000 mg | Freq: Two times a day (BID) | ORAL | Status: DC
  Filled 2011-05-15 (×4): qty 5

## 2011-05-15 MED ORDER — BISACODYL 10 MG RE SUPP: 10.0000 mg | Freq: Every day | RECTAL | Status: DC | PRN

## 2011-05-15 MED ORDER — TRAZODONE HCL 100 MG PO TABS
100.0000 mg | ORAL_TABLET | Freq: Every day | ORAL | Status: DC
  Filled 2011-05-15 (×2): qty 2

## 2011-05-15 MED ORDER — AMIODARONE HCL 200 MG PO TABS
400.0000 mg | ORAL_TABLET | Freq: Two times a day (BID) | ORAL | Status: DC
  Filled 2011-05-15 (×4): qty 2

## 2011-05-15 MED ORDER — NICOTINE 21 MG/24HR TD PT24
21.0000 mg | MEDICATED_PATCH | Freq: Every day | TRANSDERMAL | Status: DC
  Filled 2011-05-15 (×2): qty 1

## 2011-05-15 MED ORDER — DOCUSATE SODIUM 100 MG PO CAPS: 200.0000 mg | ORAL_CAPSULE | Freq: Every day | ORAL | Status: DC

## 2011-05-15 MED ORDER — LEVALBUTEROL HCL 0.63 MG/3ML IN NEBU
0.6300 mg | INHALATION_SOLUTION | Freq: Four times a day (QID) | RESPIRATORY_TRACT | Status: DC
  Filled 2011-05-15 (×9): qty 3

## 2011-05-15 MED ORDER — ESCITALOPRAM OXALATE 10 MG PO TABS
10.0000 mg | ORAL_TABLET | Freq: Every day | ORAL | Status: DC
  Filled 2011-05-15 (×2): qty 1

## 2011-05-15 MED ORDER — INSULIN GLARGINE 100 UNIT/ML ~~LOC~~ SOLN
15.0000 [IU] | SUBCUTANEOUS | Status: DC
  Filled 2011-05-15: qty 3

## 2011-05-15 MED ORDER — POTASSIUM CHLORIDE CRYS ER 20 MEQ PO TBCR: 20.0000 meq | EXTENDED_RELEASE_TABLET | Freq: Every day | ORAL | Status: DC

## 2011-05-15 MED ORDER — OXYCODONE HCL 5 MG PO TABS: 5.0000 mg | ORAL_TABLET | ORAL | Status: DC | PRN

## 2011-05-15 MED ORDER — THYROID 60 MG PO TABS
90.0000 mg | ORAL_TABLET | Freq: Every day | ORAL | Status: DC
  Filled 2011-05-15 (×3): qty 1

## 2011-05-15 MED ORDER — FUROSEMIDE 40 MG PO TABS
40.0000 mg | ORAL_TABLET | Freq: Every day | ORAL | Status: DC
  Filled 2011-05-15 (×2): qty 1

## 2011-05-15 MED ORDER — ACETAMINOPHEN 325 MG PO TABS: 650.0000 mg | ORAL_TABLET | Freq: Four times a day (QID) | ORAL | Status: DC | PRN

## 2011-05-15 MED ORDER — MAGNESIUM HYDROXIDE 400 MG/5ML PO SUSP: 30.0000 mL | Freq: Every day | ORAL | Status: DC | PRN

## 2011-05-15 MED ORDER — SODIUM CHLORIDE 0.9 % IJ SOLN: 3.0000 mL | Freq: Two times a day (BID) | INTRAMUSCULAR | Status: DC

## 2011-05-15 MED ORDER — ROSUVASTATIN CALCIUM 20 MG PO TABS
20.0000 mg | ORAL_TABLET | Freq: Every day | ORAL | Status: DC
  Filled 2011-05-15 (×2): qty 1

## 2011-05-15 MED ORDER — METFORMIN HCL 500 MG PO TABS
500.0000 mg | ORAL_TABLET | Freq: Every day | ORAL | Status: DC
  Filled 2011-05-15 (×3): qty 1

## 2011-05-15 MED ORDER — GUAIFENESIN ER 600 MG PO TB12
600.0000 mg | ORAL_TABLET | Freq: Two times a day (BID) | ORAL | Status: DC | PRN
  Filled 2011-05-15: qty 1

## 2011-05-15 MED ORDER — ENOXAPARIN SODIUM 40 MG/0.4ML ~~LOC~~ SOLN
40.0000 mg | SUBCUTANEOUS | Status: DC
  Filled 2011-05-15 (×2): qty 0.4

## 2011-05-15 MED ORDER — BISACODYL 5 MG PO TBEC: 10.0000 mg | DELAYED_RELEASE_TABLET | Freq: Every day | ORAL | Status: DC | PRN

## 2011-05-15 MED ORDER — ZOLPIDEM TARTRATE 5 MG PO TABS: 10.0000 mg | ORAL_TABLET | Freq: Every evening | ORAL | Status: DC | PRN

## 2011-05-15 MED ORDER — BISACODYL 5 MG PO TBEC: 10.0000 mg | DELAYED_RELEASE_TABLET | Freq: Every day | ORAL | Status: DC

## 2011-05-15 MED ORDER — ONDANSETRON HCL 4 MG PO TABS: 4.0000 mg | ORAL_TABLET | Freq: Four times a day (QID) | ORAL | Status: DC | PRN

## 2011-05-15 MED ORDER — ALUM & MAG HYDROXIDE-SIMETH 200-200-20 MG/5ML PO SUSP: 15.0000 mL | ORAL | Status: DC | PRN

## 2011-05-15 MED ORDER — PROMETHAZINE HCL 25 MG/ML IJ SOLN: 6.2500 mg | Freq: Four times a day (QID) | INTRAMUSCULAR | Status: DC | PRN

## 2011-05-15 MED ORDER — TRAMADOL HCL 50 MG PO TABS: 50.0000 mg | ORAL_TABLET | ORAL | Status: DC | PRN

## 2011-05-15 MED ORDER — ASPIRIN EC 325 MG PO TBEC
325.0000 mg | DELAYED_RELEASE_TABLET | Freq: Every day | ORAL | Status: DC
  Filled 2011-05-15 (×2): qty 1

## 2011-05-15 MED ORDER — INSULIN ASPART 100 UNIT/ML ~~LOC~~ SOLN: 0.0000 [IU] | Freq: Three times a day (TID) | SUBCUTANEOUS | Status: DC

## 2011-05-15 MED ORDER — ONDANSETRON HCL 4 MG/2ML IJ SOLN: 4.0000 mg | Freq: Four times a day (QID) | INTRAMUSCULAR | Status: DC | PRN

## 2011-05-15 MED ORDER — BISACODYL 10 MG RE SUPP: 10.0000 mg | Freq: Every day | RECTAL | Status: DC

## 2011-05-19 NOTE — Op Note (Signed)
NAMEWINSTON, [name] NO.:  [id number]  MEDICAL RECORD NO.:  [id number]  LOCATION:  2303                         FACILITY:  [facility]  PHYSICIAN:  Kerin Perna, M.D.  DATE OF BIRTH:  05-04-1956  DATE OF PROCEDURE:  05/10/2011 DATE OF DISCHARGE:                              OPERATIVE REPORT   OPERATION: 1. Coronary artery bypass grafting x4 (saphenous vein graft to RCA,     saphenous vein graft to distal LAD, saphenous vein graft to first     diagonal, saphenous vein graft to the obtuse marginal). 2. Endoscopic harvest of bilateral greater left saphenous vein, using     EVH. 3. Harvest of left internal mammary artery as a free graft, which was     not used due to the vessel's small size secondary to subclavian     stenosis.  SURGEON:  Kerin Perna, MD  ASSISTANTS:  Rowe Clack, PA-C, and [place], [place].  PREOPERATIVE DIAGNOSIS:  Progressive class III angina with severe multivessel coronary disease.  POSTOPERATIVE DIAGNOSIS:  Progressive class III angina with severe multivessel coronary disease.  INDICATIONS:  The patient is a 55 year old diabetic smoker with hypertension and history of substance abuse who underwent PTCA of the proximal right coronary 2-3 years previously by Dr. [place].  She returned with recurrent symptoms and a positive stress test and cardiac catheterizations documented high-grade restenosis of the right coronary over a long segment of approximately 90%.  The proximal LAD had a 95% stenosis, first diagonal had a 90% stenosis, and the circumflex had a 60% stenosis.  The patient was referred for multivessel bypass grafting.  Prior to surgery, I examined the patient in the office and reviewed the results of the cardiac cath with the patient and family.  I discussed the indications and expected benefits of multivessel bypass grafting for treatment of her coronary artery disease.  I discussed the major aspects of surgery including  location of the surgical incisions, use of general anesthesia and cardiopulmonary bypass, and the expected postoperative hospital recovery.  I discussed with the patient the risks to her of this operation including the risks of stroke, bleeding, MI, infection, and death.  After reviewing these issues and discussing alternatives to surgery, she demonstrated her understanding and agreed to proceed with the surgery under what I felt was an informed consent.  OPERATIVE FINDINGS: 1. Small size  saphenous vein conduit harvested from both     legs, too small to use below the knee. 2. 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 3. A proximal vein-to-vein anastomosis of the circumflex vein to the     diagonal vein graft due to inadequate length of quality vein for     the circumflex.  PROCEDURE:  The patient was brought to the operative room, placed supine on the operating room table.  General anesthesia was induced under invasive hemodynamic monitoring.  The chest, abdomen, and legs were prepped with Betadine and  draped as a sterile field.  A sternal incision was made as the saphenous vein was harvested endoscopically from the right leg.  The left internal mammary artery was harvested initially as a pedicle graft from its origin at the subclavian vessels.  However, when the vessel was detached and opened, there was minimal flow through the vessel consistent with high-grade proximal subclavian artery stenosis.  For that reason, the mammary artery was divided at its origin, and flushed with papaverine for use later as a potential free graft.  The sternal retractor was then placed and the pericardium was opened and suspended.  Pursestrings were placed in the ascending aorta and right atrium and after the vein had been  harvested from the right leg, the patient was heparinized and cannulated for cardiopulmonary bypass.  The vein was inspected and found to be adequate, and the patient was placed on cardiopulmonary bypass.  Cardioplegic cannulas were placed for both antegrade and retrograde cold blood cardioplegia.  The patient was cooled to 32 degrees and the aortic crossclamp was applied.  800 mL of cold blood cardioplegia was delivered in split doses between the antegrade aortic and retrograde coronary sinus catheters.  There was good cardioplegic arrest.  Septal temperature dropped less than 10 degrees.  The distal coronary anastomoses was then performed.  The first distal anastomosis was to the right coronary just past the RV marginal branch. It was a 1.5 mm vessel, and there was a proximal 90% stenosis.  Reverse saphenous vein was sewn end-to-side with running 7-0 Prolene with good flow through the graft.  The second distal anastomosis was to the circumflex marginal branch of the left coronary.  It had a proximal 60% stenosis.  Reverse saphenous vein was sewn end-to-side with running 7-0 Prolene with good flow through the graft.  The third distal anastomosis was then performed to the diagonal branch of the LAD.  This was a 1.2 mm vessel with proximal 90% stenosis.  Reverse saphenous vein was sewn end- to-side with running 7-0 Prolene with good flow through the graft. Cardioplegia was redosed.  The fourth distal anastomosis was to the distal LAD.  More proximally, it was deeply intramyocardial.  The mammary pedicle was examined.  It had to be shortened due to extremely small size of the distal aspect.  I felt that there was still enough length to place this as a free graft with the proximal anastomosis to a segment of proximal saphenous vein graft.  An end-to-side anastomosis to this 1.4 mm vessel was sewn with running 8-0 Prolene, and there appeared to be good flow through the graft on hand  injection through a catheter cannulated to the proximal origin of the left IMA free graft. Cardioplegia was redosed.  The proximal anastomoses was then performed while under cross clamp. While we were doing this, I had more vein harvested from the left leg in case the mammary artery free graft was not adequate.  Proximal anastomoses were constructed using a 4.0 mm punch running 7-0 Prolene of the diagonal graft and of the RCA graft.  The circumflex graft was not long enough to reach the aorta and was sewn to the proximal aspect of the diagonal vein graft in an end-to-side fashion using running 7-0 Prolene.  There was good flow through the graft and was properly oriented.  Next, the left IMA was attempted to be sewn into the proximal portion of the vein graft going to the diagonal as well.  However, the length of the pedicle graft  appeared to be short and I was unhappy with the results, so the mammary free graft was abandoned and a vein graft was ultimately used.  The site where the proximal anastomosis of the mammary free graft to the diagonal was attempted was repaired with interrupted 8- 0 Prolene.  The mammary to vein anastomosis was performed with heart beating on bypass and at this point, since we were going to redo the LAD anastomosis, I re-cooled the patient and used cross clamp for cardioplegic arrest to sew a vein to the distal LAD with running 7-0 Prolene, with good flow through the graft.  While the crossclamp was still in place the second time, a proximal anastomosis of the vein graft to the LAD was placed on the aorta using running 7-0 Prolene.  The cross- clamp was then removed after air was vented from the left side of the heart and the coronaries.  The proximal distal anastomoses were all inspected and found to be hemostatic.  The vein-to-vein anastomosis of the proximal circumflex to the proximal diagonal vein was hemostatic.  The patient was rewarmed  and reperfused and temporary pacing wires were applied.  When the patient was rewarmed to 37 degrees, the lungs re-expanded and the ventilator was resumed.  The patient was weaned off bypass on 2 mcg/kg per minute of dobutamine with excellent hemodynamics and a normal-appearing EKG.  The heart contraction was vigorous.  The cardiac output was 5 L.  Protamine was administered without adverse reaction.  Due to the long pump run, a unit of platelets was given in view of the patient being on long-term Plavix preoperatively.  The patient remained hemodynamically stable. The superior pericardial fat was closed over the aorta.  A mediastinal and left pleural chest tube were placed and brought out through separate incisions.  The sternum was closed with interrupted steel wire.  The pectoralis fascia was closed in running #1 Vicryl.  The subcutaneous and skin layers were closed with a running Vicryl as well.  Total cardiopulmonary bypass time was 239 minutes.     Kerin Perna, M.D.     PV/MEDQ  D:  05/10/2011  T:  05/11/2011  Job:  [id number]  cc:   Chontel Warning M. [place], M.D.  Electronically Signed by Kerin Perna M.D. on 05/19/2011 08:09:49 AM

## 2011-05-20 ENCOUNTER — Other Ambulatory Visit: Payer: Self-pay

## 2011-05-20 ENCOUNTER — Encounter: Payer: Self-pay | Admitting: *Deleted

## 2011-05-20 DIAGNOSIS — Z951 Presence of aortocoronary bypass graft: Secondary | ICD-10-CM

## 2011-05-20 DIAGNOSIS — I2510 Atherosclerotic heart disease of native coronary artery without angina pectoris: Secondary | ICD-10-CM

## 2011-05-20 MED ORDER — HYDROCODONE-ACETAMINOPHEN 5-500 MG PO TABS: 1.0000 | ORAL_TABLET | Freq: Four times a day (QID) | ORAL | Status: DC | PRN

## 2011-05-26 NOTE — Discharge Summary (Signed)
[name], Reilly NO.:  [id number]  MEDICAL RECORD NO.:  [id number]  LOCATION:  2011                         FACILITY:  [facility]  PHYSICIAN:  Kerin Perna, M.D.  DATE OF BIRTH:  1956/02/11  DATE OF ADMISSION:  05/10/2011 DATE OF DISCHARGE:  05/15/2011                              DISCHARGE SUMMARY   HISTORY:  The patient is a 54 year old white female who was referred to Kerin Perna, MD for surgical consideration of revascularization for severe multivessel coronary artery disease.  The patient has a history of a previous PTCA of the high-grade right coronary artery stenosis in 2010, at which time she presented with a non ST-segment elevation myocardial infarction.  She did well until recently when she began having chest heaviness with exertion, relieved by rest.  A stress test was positive for ischemia in the anterior apical region, and so she underwent cardiac catheterizations via the right radial artery by Dr. Peter Swaziland on April 30, 2011.  This demonstrated a high-grade 95% restenosis of the right coronary, which was a long lesion. Additionally, there was a high-grade proximal 95% stenosis of the LAD, and a 90% stenosis of the first diagonal.  Ejection fraction was about 60% and LVEDP was 11.  The circumflex had a 60% ostial stenosis.  It was felt that she would benefit from multivessel bypass grafting.  She does not have any rest symptoms.  The patient denies family history of coronary disease.  Risk factors include smoking, recently diagnosed diabetes, hypertension, and dyslipidemia.  She was admitted this hospitalization for the procedure.  PAST MEDICAL HISTORY: 1. Hypertension. 2. Coronary artery disease as described above. 3. Right carotid bruit with 40-60% bilateral carotid stenoses. 4. Dyslipidemia. 5. History of polysubstance abuse including tobacco alcohol and     cocaine.  She has been drug-free for the past year.  PAST SURGICAL  HISTORY: 1. Angioplasty of the right coronary in June 2010. 2. History of cervical fusion. 3. History of removal of colon polyps. 4. History of vaginal hysterectomy.  FAMILY HISTORY:  Coronary artery disease in her mother.  SOCIAL HISTORY:  She smokes 1 pack per day.  Alcohol use none.  MEDICATIONS PRIOR TO ADMISSION: 1. Aspirin 81 mg daily. 2. Catapres 0.1 mg daily. 3. Plavix 75 mg daily. 4. Lexapro 10 mg daily. 5. Lunesta 2 tablets daily. 6. Hydrocodone 5/500 mg tablets p.r.n. 7. Imdur 60 mg daily. 8. Armour Thyroid 90 mcg daily. 9. Glucophage XR 500 mg daily. 10.Toprol-XL 25 mg daily. 11.Nitroglycerin p.r.n. sublingual 0.4 mg tablets. 12.Altace 5 mg daily. 13.Crestor 20 mg daily. 14.Trazodone 100 mg daily at bedtime.  ALLERGIES:  DARVOCET and ERYTHROMYCIN.  Review of systems, and physical exam, please see the history and physical done at the time of admission.  HOSPITAL COURSE:  The patient was admitted electively and on May 10, 2011, she underwent coronary artery bypass grafting x4.  The following grafts were placed. 1. Saphenous vein graft to the right coronary. 2. Saphenous vein graft to the distal LAD. 3. Saphenous vein graft to the first diagonal. 4. Saphenous vein graft to the obtuse marginal.  Note, the left internal mammary artery was harvested  as a free graft, but was not used due to the small size secondary to subclavian stenosis. The patient tolerated the procedure well, and was taken to the surgical intensive care unit in stable condition.  POSTOPERATIVE HOSPITAL COURSE:  The patient has overall progressed nicely.  She was extubated without difficulty.  She has remained neurologically intact.  She does have an acute blood loss anemia, which is stabilized.  Most recent hemoglobin and hematocrit dated May 14, 2011 are 9.5 and 29.4 respectively.  Electrolytes, BUN and creatinine are within normal limits.  She has had some volume overload, but  is responding well to diuretics.  Incisions are healing well without evidence of infection.  She has had postoperative atrial fibrillation. Amiodarone has been started.  Additionally, digoxin has been started. She is currently in normal sinus rhythm.  Her incisions are healing well without evidence of infection.  She is tolerating gradual increasing activities using standard protocols.  Oxygen is being weaned, and she has good saturations, currently on 2 L.  She is responding well to pulmonary toilet.  Her glucose management has been per standard protocols.  She is transitioning back to her home regimen.  Her sugars have been under adequate control.  Her overall status is felt to be tentatively stable for discharge in the next day or so pending further evaluation of her overall progress including heart rhythm.  CONDITION ON DISCHARGE:  Stable and improving.  Medications at the time of this dictation included the following: 1. Amiodarone 400 mg twice daily for 7 days, then 400 mg daily. 2. Digoxin 0.125 mg daily. 3. Lasix 40 mg daily for an additional 7 days. 4. Guaifenesin 600 mg p.r.n. 5. Nicotine patch 21 mg per hour daily. 6. Oxycodone 5-10 mg every 4-6 hours p.r.n. 7. Potassium chloride 20 mEq daily for 7 days. 8. Aspirin 325 mg enteric-coated tablet daily. 9. Lexapro 10 mg daily. 10.Lunesta 2 mg daily at bedtime p.r.n. 11.Metformin 500 mg daily. 12.Toprol-XL 25 mg daily. 13.Crestor 20 mg daily. 14.Armour Thyroid 90 mg daily. 15.Trazodone 100 mg 1-2 daily p.r.n. at bedtime.  INSTRUCTIONS:  The patient will receive written instructions regarding medications, activity, diet, wound care, and followup.  Followup to include Dr. Zenaida Niece Trigt's office on June 07, 2011, at 1:00 p.m. Additionally, she is instructed to follow up with Dr. Swaziland in 2 weeks.  FINAL DIAGNOSES:  Severe multivessel coronary artery disease status post surgical revascularization as described.  OTHER  DIAGNOSES: 1. Postoperative atrial fibrillation. 2. Postoperative volume overload. 3. Postoperative acute blood loss anemia. 4. Finding of subclavian stenosis and usable mammary artery due to     small size. 5. History of extracranial cerebrovascular occlusive disease. 6. History of hypertension. 7. History of previous myocardial infarction. 8. History of dyslipidemia. 9. History of polysubstance abuse. 10.History of tobacco abuse. 11.History of previous right coronary angioplasty. 12.History of cervical fusion. 13.History of colon polypectomy. 14.History of vaginal hysterectomy.     Rowe Clack, P.A.-C.   ______________________________ Kerin Perna, M.D.    Sherryll Burger  D:  05/14/2011  T:  05/14/2011  Job:  [id number]  cc:   [name], M.D. Peter M. Swaziland, M.D.  Electronically Signed by Deniece Portela GOLD P.A.-C. on 05/21/2011 12:56:55 PM Electronically Signed by Kerin Perna M.D. on 05/26/2011 07:56:51 AM

## 2011-05-28 ENCOUNTER — Encounter: Payer: Self-pay | Admitting: *Deleted

## 2011-05-28 ENCOUNTER — Other Ambulatory Visit: Payer: Self-pay | Admitting: *Deleted

## 2011-05-28 ENCOUNTER — Telehealth: Payer: Self-pay | Admitting: *Deleted

## 2011-05-28 DIAGNOSIS — Z951 Presence of aortocoronary bypass graft: Secondary | ICD-10-CM

## 2011-05-28 DIAGNOSIS — I2510 Atherosclerotic heart disease of native coronary artery without angina pectoris: Secondary | ICD-10-CM

## 2011-05-28 DIAGNOSIS — G8918 Other acute postprocedural pain: Secondary | ICD-10-CM

## 2011-05-28 MED ORDER — HYDROCODONE-ACETAMINOPHEN 5-500 MG PO TABS: 1.0000 | ORAL_TABLET | Freq: Four times a day (QID) | ORAL | Status: DC | PRN

## 2011-05-28 NOTE — Telephone Encounter (Signed)
I CALLED HER REFILL FOR HYDROCODONE 5/500 #40 TABLETS TO HER PHARMACY.

## 2011-06-01 ENCOUNTER — Other Ambulatory Visit: Payer: Self-pay | Admitting: Cardiothoracic Surgery

## 2011-06-01 DIAGNOSIS — I2510 Atherosclerotic heart disease of native coronary artery without angina pectoris: Secondary | ICD-10-CM

## 2011-06-07 ENCOUNTER — Ambulatory Visit
Admission: RE | Admit: 2011-06-07 | Discharge: 2011-06-07 | Disposition: A | Discharge status: Home / Self Care | Payer: BC Managed Care – PPO | Source: Ambulatory Visit | Attending: Cardiothoracic Surgery | Admitting: Cardiothoracic Surgery

## 2011-06-07 ENCOUNTER — Ambulatory Visit (INDEPENDENT_AMBULATORY_CARE_PROVIDER_SITE_OTHER): Payer: Self-pay | Admitting: Surgical

## 2011-06-07 VITALS — BP 131/79 | HR 93 | Resp 16 | Ht 67.0 in | Wt 171.0 lb

## 2011-06-07 DIAGNOSIS — I2510 Atherosclerotic heart disease of native coronary artery without angina pectoris: Secondary | ICD-10-CM

## 2011-06-07 DIAGNOSIS — Z951 Presence of aortocoronary bypass graft: Secondary | ICD-10-CM

## 2011-06-07 DIAGNOSIS — G8918 Other acute postprocedural pain: Secondary | ICD-10-CM

## 2011-06-07 MED ORDER — HYDROCODONE-ACETAMINOPHEN 7.5-750 MG PO TABS: 1.0000 | ORAL_TABLET | Freq: Four times a day (QID) | ORAL | Status: DC | PRN

## 2011-06-07 MED ORDER — HYDROCODONE-ACETAMINOPHEN 5-500 MG PO TABS: 1.0000 | ORAL_TABLET | ORAL | Status: DC | PRN

## 2011-06-07 NOTE — Patient Instructions (Signed)
Coronary Artery Bypass Grafting Care After Refer to this sheet in the next few weeks. These instructions provide you with information on caring for yourself after your procedure. Your caregiver may also give you more specific instructions. Your treatment has been planned according to current medical practices, but problems sometimes occur. Call your caregiver if you have any problems or questions after your procedure.  Recovery from open heart surgery will be different for everyone. Some people feel well after 3 or 4 weeks, while for others it takes longer. After heart surgery, it may be normal to:  Not have an appetite, feel nauseated by the smell of food, or only want to eat a small amount.   Be constipated because of changes in your diet, activity, and medicines. Eat foods high in fiber. Add fresh fruits and vegetables to your diet. Stool softeners may be helpful.   Feel sad or unhappy. You may be frustrated or cranky. You may have good days and bad days. Do not give up. Talk to your caregiver if you do not feel better.   Feel weakness and fatigue. You many need physical therapy or cardiac rehabilitation to get your strength back.   Develop an irregular heartbeat called atrial fibrillation. Symptoms of atrial fibrillation are a fast, irregular heartbeat or feelings of fluttery heartbeats, shortness of breath, low blood pressure, and dizziness. If these symptoms develop, see your caregiver right away.  MEDICATION  Have a list of all the medicines you will be taking when you leave the hospital. For every medicine, know the following:   Name.   Exact dose.   Time of day to be taken.   How often it should be taken.   Why you are taking it.   Ask which medicines should or should not be taken together. If you take more than one heart medicine, ask if it is okay to take them together. Some heart medicines should not be taken at the same time because they may lower your blood pressure too  much.   Narcotic pain medicine can cause constipation. Eat fresh fruits and vegetables. Add fiber to your diet. Stool softener medicine may help relieve constipation.   Keep a copy of your medicines with you at all times.   Do not add or stop taking any medicine until you check with your caregiver.   Medicines can have side effects. Call your caregiver who prescribed the medicine if you:   Start throwing up, have diarrhea, or have stomach pain.   Feel dizzy or lightheaded when you stand up.   Feel your heart is skipping beats or is beating too fast or too slow.   Develop a rash.   Notice unusual bruising or bleeding.  HOME CARE INSTRUCTIONS  After heart surgery, it is important to learn how to take your pulse. Have your caregiver show you how to take your pulse.   Use your incentive spirometer. Ask your caregiver how long after surgery you need to use it.  Care of your chest incision  Tell your caregiver right away if you notice clicking in your chest (sternum).   Support your chest with a pillow or your arms when you take deep breaths and cough.   Follow your caregiver's instructions about when you can bathe or swim.   Protect your incision from sunlight during the first year to keep the scar from getting dark.   Tell your caregiver if you notice:   Increased tenderness of your incision.   Increased redness   or swelling around your incision.   Drainage or pus from your incision.  Care of your leg incision(s)  Avoid crossing your legs.   Avoid sitting for long periods of time. Change positions every half hour.   Elevate your leg(s) when you are sitting.   Check your leg(s) daily for swelling. Check the incisions for redness or drainage.   Wear your elastic stockings as told by your caregiver. Take them off at bedtime.  Diet  Diet is very important to heart health.   Eat plenty of fresh fruits and vegetables. Meats should be lean cut. Avoid canned, processed, and  fried foods.   Talk to a dietician. They can teach you how to make healthy food and drink choices.  Weight  Weigh yourself every day. This is important because it helps to know if you are retaining fluid that may make your heart and lungs work harder.   Use the same scale each time.   Weigh yourself every morning at the same time. You should do this after you go to the bathroom, but before you eat breakfast.   Your weight will be more accurate if you do not wear any clothes.   Record your weight.   Tell your caregiver if you have gained 2 pounds or more overnight.  Activity Stop any activity at once if you have chest pain, shortness of breath, irregular heartbeats, or dizziness. Get help right away if you have any of these symptoms.  Bathing.  Avoid soaking in a bath or hot tub until your incisions are healed.   Rest. You need a balance of rest and activity.   Exercise. Exercise per your caregiver's advice. You may need physical therapy or cardiac rehabilitation to help strengthen your muscles and build your endurance.   Climbing stairs. Unless your caregiver tells you not to climb stairs, go up stairs slowly and rest if you tire. Do not pull yourself up by the handrail.   Driving a car. Follow your caregiver's advice on when you may drive. You may ride as a passenger at any time. When traveling for long periods of time in a car, get out of the car and walk around for a few minutes every 2 hours.   Lifting. Avoid lifting, pushing, or pulling anything heavier than 10 pounds for 6 weeks after surgery or as told by your caregiver.   Returning to work. Check with your caregiver. People heal at different rates. Most people will be able to go back to work 6 to 12 weeks after surgery.   Sexual activity. You may resume sexual relations as told by your caregiver.  SEEK MEDICAL CARE IF:  Any of your incisions are red, painful, or have any type of drainage coming from them.   You have an  oral temperature above 102 F (38.9 C).   You have ankle or leg swelling.   You have pain in your legs.   You have weight gain of 2 or more pounds a day.   You feel dizzy or lightheaded when you stand up.  SEEK IMMEDIATE MEDICAL CARE IF:  You have angina or chest pain that goes to your jaw or arms. Call your local emergency services right away.   You have shortness of breath at rest or with activity.   You have a fast or irregular heartbeat (arrhythmia).   There is a "clicking" in your sternum when you move.   You have numbness or weakness in your arms or legs.    MAKE SURE YOU:  Understand these instructions.   Will watch your condition.   Will get help right away if you are not doing well or get worse.  Document Released: 01/15/2005 Document Revised: 03/10/2011 Document Reviewed: 09/02/2010 [employer] Patient Information 2012 [employer]. 

## 2011-06-07 NOTE — Progress Notes (Signed)
  HPI: Patient returns for routine postoperative follow-up having undergone Cabg X4 on 05/10/2011. The patient's early postoperative recovery while in the hospital was notable for being mostly unremarkable. . Since hospital discharge the patient reports her main issue has been sternal discomfort..   Current Outpatient Prescriptions  Medication Sig Dispense Refill  . aspirin 325 MG EC tablet Take 325 mg by mouth daily.        Marland Kitchen escitalopram (LEXAPRO) 10 MG tablet Take 10 mg by mouth daily.       . eszopiclone (LUNESTA) 2 MG TABS Take 2 mg by mouth as needed. Take immediately before bedtime  for sleep      . HYDROcodone-acetaminophen (VICODIN) 5-500 MG per tablet Take 1-2 tablets by mouth every 4 (four) hours as needed for pain (may take one or two every 4-6 hrs prn pain). For pain  40 tablet  0  . levothyroxine (SYNTHROID, LEVOTHROID) 25 MCG tablet Take 25 mcg by mouth daily.        . metFORMIN (GLUCOPHAGE-XR) 500 MG 24 hr tablet Take 1 tablet by mouth Daily.      . metoprolol succinate (TOPROL-XL) 25 MG 24 hr tablet Take 1 tablet (25 mg total) by mouth daily.  30 tablet  4  . rosuvastatin (CRESTOR) 20 MG tablet Take 20 mg by mouth daily.        Marland Kitchen thyroid (ARMOUR) 60 MG tablet Take 90 mg by mouth daily.       . traZODone (DESYREL) 100 MG tablet Take 100-200 mg by mouth at bedtime. Take 1 tablet if it doesn't work take 2 tablets      . DISCONTD: HYDROcodone-acetaminophen (VICODIN) 5-500 MG per tablet Take 1 tablet by mouth every 6 (six) hours as needed for pain (may take one or two every 4-6 hrs prn pain). For pain  40 tablet  0  . cloNIDine (CATAPRES) 0.1 MG tablet Take 0.1 mg by mouth daily.       . clopidogrel (PLAVIX) 75 MG tablet Take 1 tablet (75 mg total) by mouth daily.  30 tablet  5  . HYDROcodone-acetaminophen (VICODIN ES) 7.5-750 MG per tablet Take 1 tablet by mouth every 6 (six) hours as needed for pain.  20 tablet  0  . isosorbide mononitrate (IMDUR) 60 MG 24 hr tablet Take 1 tablet  (60 mg total) by mouth daily.  30 tablet  4  . nitroGLYCERIN (NITROSTAT) 0.4 MG SL tablet Place 0.4 mg under the tongue every 5 (five) minutes as needed.        . ramipril (ALTACE) 5 MG tablet Take 1 tablet (5 mg total) by mouth daily.  30 tablet  4    Physical Exam: Gen: NAD Lungs: clear to ausc COR: RRR without gallop or rub Ext: no edema Incisions: healing well without signs of infection.  Diagnostic Tests: CXR: minor effusions otherwise clear  Impression Doing well Wrote RX for Vicodin 7.5-750 mg #40  Plan: She will see Dr. Swaziland this week. We will see prn. Discussed lifting restrictions, and she is not to drive while still useing narcotics.

## 2011-06-09 ENCOUNTER — Encounter: Payer: BC Managed Care – PPO | Admitting: Cardiology

## 2011-06-11 ENCOUNTER — Encounter: Payer: Self-pay | Admitting: Nurse Practitioner

## 2011-06-11 ENCOUNTER — Ambulatory Visit (INDEPENDENT_AMBULATORY_CARE_PROVIDER_SITE_OTHER): Payer: BC Managed Care – PPO | Admitting: Nurse Practitioner

## 2011-06-11 VITALS — BP 102/80 | HR 68 | Ht 67.0 in | Wt 169.8 lb

## 2011-06-11 DIAGNOSIS — E785 Hyperlipidemia, unspecified: Secondary | ICD-10-CM

## 2011-06-11 DIAGNOSIS — I779 Disorder of arteries and arterioles, unspecified: Secondary | ICD-10-CM

## 2011-06-11 DIAGNOSIS — I2510 Atherosclerotic heart disease of native coronary artery without angina pectoris: Secondary | ICD-10-CM

## 2011-06-11 DIAGNOSIS — Z951 Presence of aortocoronary bypass graft: Secondary | ICD-10-CM

## 2011-06-11 DIAGNOSIS — Z720 Tobacco use: Secondary | ICD-10-CM

## 2011-06-11 DIAGNOSIS — F17200 Nicotine dependence, unspecified, uncomplicated: Secondary | ICD-10-CM

## 2011-06-11 DIAGNOSIS — I10 Essential (primary) hypertension: Secondary | ICD-10-CM

## 2011-06-11 LAB — CBC WITH DIFFERENTIAL/PLATELET
Basophils Absolute: 0.1 10*3/uL (ref 0.0–0.1)
Basophils Relative: 0.8 % (ref 0.0–3.0)
Eosinophils Absolute: 1 10*3/uL — ABNORMAL HIGH (ref 0.0–0.7)
Eosinophils Relative: 10.9 % — ABNORMAL HIGH (ref 0.0–5.0)
HCT: 35.9 % — ABNORMAL LOW (ref 36.0–46.0)
Hemoglobin: 11.8 g/dL — ABNORMAL LOW (ref 12.0–15.0)
Lymphocytes Relative: 27.9 % (ref 12.0–46.0)
Lymphs Abs: 2.5 10*3/uL (ref 0.7–4.0)
MCHC: 32.7 g/dL (ref 30.0–36.0)
MCV: 99.8 fl (ref 78.0–100.0)
Monocytes Absolute: 0.8 10*3/uL (ref 0.1–1.0)
Monocytes Relative: 9.5 % (ref 3.0–12.0)
Neutro Abs: 4.6 10*3/uL (ref 1.4–7.7)
Neutrophils Relative %: 50.9 % (ref 43.0–77.0)
Platelets: 400 10*3/uL (ref 150.0–400.0)
RBC: 3.6 Mil/uL — ABNORMAL LOW (ref 3.87–5.11)
RDW: 15.5 % — ABNORMAL HIGH (ref 11.5–14.6)
WBC: 8.9 10*3/uL (ref 4.5–10.5)

## 2011-06-11 LAB — BASIC METABOLIC PANEL
BUN: 18 mg/dL (ref 6–23)
CO2: 25 mEq/L (ref 19–32)
Calcium: 8.9 mg/dL (ref 8.4–10.5)
Chloride: 109 mEq/L (ref 96–112)
Creatinine, Ser: 0.7 mg/dL (ref 0.4–1.2)
GFR: 88.49 mL/min (ref >60.00)
Glucose, Bld: 84 mg/dL (ref 70–99)
Potassium: 3.7 mEq/L (ref 3.5–5.1)
Sodium: 140 mEq/L (ref 135–145)

## 2011-06-11 MED ORDER — NITROGLYCERIN 0.4 MG SL SUBL: 0.4000 mg | SUBLINGUAL_TABLET | SUBLINGUAL | Status: AC | PRN

## 2011-06-11 NOTE — Assessment & Plan Note (Signed)
Blood pressure is ok. Will need to continue to monitor.

## 2011-06-11 NOTE — Assessment & Plan Note (Signed)
She is a month post op. Doing ok overall. Will be stopping her amiodarone over the next week. We will check follow up labs today. She is enrolled in cardiac rehab. Smoking cessation is encouraged. We will see her back in about 2 months. Patient is agreeable to this plan and will call if any problems develop in the interim.

## 2011-06-11 NOTE — Assessment & Plan Note (Signed)
Will need annual follow up.

## 2011-06-11 NOTE — Assessment & Plan Note (Signed)
We discussed the need for smoking cessation in depth today.

## 2011-06-11 NOTE — Progress Notes (Signed)
[name] Date of Birth: May 16, 1956 Medical Record #[id number]  History of Present Illness: [name] is seen today for a post hospital visit. She is seen for Dr. Swaziland. She has had CABG x 4 one month ago. Her left internal mammary artery was harvested as a free graft but not able to be used due to small size secondary to subclavian stenosis. Her post op course was complicated by atrial fib. She is on a tapering dose of amiodarone and will be discontinuing the amiodarone in one week. Her other problems include ongoing tobacco abuse, hypothyroidism and hyperlipidemia.   She has been home almost a month. She continues to smoke but says she has cut back to one pack a day. She will be starting cardiac rehab next week. Still with some chest soreness. She has already seen Dr. Maren Beach and released from his care. Bowels are ok. Appetite has returned. She has some issues with sleeping but that is a chronic issue. She is not short of breath. No palpitations.   Current Outpatient Prescriptions on File Prior to Visit  Medication Sig Dispense Refill  . aspirin 325 MG EC tablet Take 325 mg by mouth daily.        . eszopiclone (LUNESTA) 2 MG TABS Take 2 mg by mouth as needed. Take immediately before bedtime  for sleep      . HYDROcodone-acetaminophen (VICODIN ES) 7.5-750 MG per tablet Take 1 tablet by mouth every 6 (six) hours as needed for pain.  20 tablet  0  . levothyroxine (SYNTHROID, LEVOTHROID) 25 MCG tablet Take 25 mcg by mouth daily.        . metFORMIN (GLUCOPHAGE-XR) 500 MG 24 hr tablet Take 1 tablet by mouth Daily.      . metoprolol succinate (TOPROL-XL) 25 MG 24 hr tablet Take 1 tablet (25 mg total) by mouth daily.  30 tablet  4  . rosuvastatin (CRESTOR) 20 MG tablet Take 20 mg by mouth daily.        Marland Kitchen thyroid (ARMOUR) 60 MG tablet Take 90 mg by mouth daily.       . traZODone (DESYREL) 100 MG tablet Take 100-200 mg by mouth at bedtime. Take 1 tablet if it doesn't work take 2 tablets      .  DISCONTD: nitroGLYCERIN (NITROSTAT) 0.4 MG SL tablet Place 0.4 mg under the tongue every 5 (five) minutes as needed.        Marland Kitchen DISCONTD: HYDROcodone-acetaminophen (VICODIN) 5-500 MG per tablet Take 1-2 tablets by mouth every 4 (four) hours as needed for pain (may take one or two every 4-6 hrs prn pain). For pain  40 tablet  0    Allergies  Allergen Reactions  . Darvocet (Propoxyphene N-Acetaminophen) Nausea And Vomiting  . Erythromycin Nausea And Vomiting    Past Medical History  Diagnosis Date  . Hypertension   . Coronary artery disease 12-2008    POST INFERIOR ST ELEVATION MYOCARDIAL INFARCTION  . MI (myocardial infarction) 12-2008  . Carotid arterial disease     RIGHT CAROTID BRUIT WITH 40-60% STENOSIS BILATERALLY  . Dyslipidemia   . Polysubstance abuse     WITH TOBACCO, ALCOHOL, AND CRACK COCAINE. NOW DRUG FREE FOR THE PAST YEAR  . Tobacco abuse     Past Surgical History  Procedure Date  . Coronary angioplasty 12/2008    RIGHT CORONARY   . Cervical fusion   . Removal of colon polyps   . Vaginal hysterectomy     History  Smoking  status  . Current Everyday Smoker -- 1.0 packs/day  . Types: Cigarettes  Smokeless tobacco  . Not on file    History  Alcohol Use No    Family History  Problem Relation Age of Onset  . Coronary artery disease Mother   . Heart disease Mother     Review of Systems: The review of systems is per the HPI.  All other systems were reviewed and are negative.  Physical Exam: BP 102/80  Pulse 68  Ht 5\' 7"  (1.702 m)  Wt 169 lb 12.8 oz (77.021 kg)  BMI 26.59 kg/m2 Patient is alert and in no acute distress. Skin is weathered and dry. Color is normal.  HEENT is unremarkable. Normocephalic/atraumatic. PERRL. Sclera are nonicteric. Neck is supple. No masses. No JVD. Lungs are fairly clear. Cardiac exam shows a regular rate and rhythm. Abdomen is soft. Extremities are without edema. Gait and ROM are intact. No gross neurologic deficits  noted.   LABORATORY DATA:   EKG shows sinus rhythm. She has a RBBB which appears to be new with diffuse ST/T wave changes. QT is 424 with QTc of 454.    Assessment / Plan:

## 2011-06-11 NOTE — Patient Instructions (Signed)
We are going to check some blood work today.  We will see you in 2 months with fasting labs.  Ok to go to cardiac rehab.  Try to stop smoking.  Stay on your current medicines. Finish your amiodarone and do not refill.   Call the [facility] office at [phone] if you have any questions, problems or concerns.

## 2011-06-14 ENCOUNTER — Telehealth: Payer: Self-pay | Admitting: Cardiology

## 2011-06-14 ENCOUNTER — Encounter: Payer: Self-pay | Admitting: *Deleted

## 2011-06-14 ENCOUNTER — Telehealth: Payer: Self-pay | Admitting: *Deleted

## 2011-06-14 ENCOUNTER — Other Ambulatory Visit: Payer: Self-pay | Admitting: *Deleted

## 2011-06-14 DIAGNOSIS — G8918 Other acute postprocedural pain: Secondary | ICD-10-CM

## 2011-06-14 MED ORDER — HYDROCODONE-ACETAMINOPHEN 7.5-750 MG PO TABS: 1.0000 | ORAL_TABLET | Freq: Four times a day (QID) | ORAL | Status: DC | PRN

## 2011-06-14 NOTE — Telephone Encounter (Signed)
New problem Pt wants refill of hydrocodone 7.5 prescribed by dr Deniece Portela gold They have released her since her open heart surgery She didn't know where to call

## 2011-06-14 NOTE — Telephone Encounter (Signed)
Called requesting refill on Hydrocodone. Per [name] is a month from surgery and can not give her another Rx for narcotics. Advised to take Advil or ES Tylenol. She will try that.

## 2011-06-14 NOTE — Telephone Encounter (Signed)
Message copied by Lorayne Bender on Mon Jun 14, 2011  6:11 PM ------      Message from: Rosalio Macadamia      Created: Mon Jun 14, 2011  7:50 AM       Ok to report. Post op labs are satisfactory.

## 2011-06-14 NOTE — Telephone Encounter (Signed)
Spoke with husband and gave lab results. Will send copy to Dr. Collins Scotland.

## 2011-06-15 ENCOUNTER — Encounter: Payer: Self-pay | Admitting: *Deleted

## 2011-06-17 ENCOUNTER — Encounter (HOSPITAL_COMMUNITY)
Admission: RE | Admit: 2011-06-17 | Discharge: 2011-06-17 | Disposition: A | Discharge status: Home / Self Care | Payer: BC Managed Care – PPO | Source: Ambulatory Visit | Attending: Cardiology | Admitting: Cardiology

## 2011-06-17 ENCOUNTER — Encounter (HOSPITAL_COMMUNITY): Payer: Self-pay

## 2011-06-17 DIAGNOSIS — I2510 Atherosclerotic heart disease of native coronary artery without angina pectoris: Secondary | ICD-10-CM | POA: Insufficient documentation

## 2011-06-17 DIAGNOSIS — Z951 Presence of aortocoronary bypass graft: Secondary | ICD-10-CM | POA: Insufficient documentation

## 2011-06-17 DIAGNOSIS — Z5189 Encounter for other specified aftercare: Secondary | ICD-10-CM | POA: Insufficient documentation

## 2011-06-17 NOTE — Patient Instructions (Signed)
Pt has finished orientation and is scheduled to start CR on 06/21/11 at 11 am. Pt has been instructed to arrive to class 15 minutes early for scheduled class. Pt has been instructed to wear comfortable clothing and shoes with rubber soles. Pt has been told to take their medications 1 hour prior to coming to class.  If the patient is not going to attend class, he/she has been instructed to call.

## 2011-06-17 NOTE — Progress Notes (Signed)
During orientation advised patient on arrival and appointment times what to wear, what to do before, during and after exercise. Reviewed attendance and class policy. Talked about inclement weather and class consultation policy. Pt is scheduled to start Cardiac Rehab on 06/21/11 at 11 am. Pt was advised to come to class 5 minutes before class starts. He was also given instructions on meeting with the dietician and attending the Family Structure classes. Pt is eager to get started.

## 2011-06-21 ENCOUNTER — Encounter (HOSPITAL_COMMUNITY)
Admission: RE | Admit: 2011-06-21 | Discharge: 2011-06-21 | Disposition: A | Discharge status: Home / Self Care | Payer: BC Managed Care – PPO | Source: Ambulatory Visit | Attending: Cardiology | Admitting: Cardiology

## 2011-06-23 ENCOUNTER — Encounter (HOSPITAL_COMMUNITY)
Admission: RE | Admit: 2011-06-23 | Discharge: 2011-06-23 | Disposition: A | Discharge status: Home / Self Care | Payer: BC Managed Care – PPO | Source: Ambulatory Visit | Attending: Cardiology | Admitting: Cardiology

## 2011-06-25 ENCOUNTER — Encounter (HOSPITAL_COMMUNITY)
Admission: RE | Admit: 2011-06-25 | Discharge: 2011-06-25 | Disposition: A | Discharge status: Home / Self Care | Payer: BC Managed Care – PPO | Source: Ambulatory Visit | Attending: Cardiology | Admitting: Cardiology

## 2011-06-28 ENCOUNTER — Encounter (HOSPITAL_COMMUNITY): Payer: BC Managed Care – PPO

## 2011-06-30 ENCOUNTER — Encounter (HOSPITAL_COMMUNITY): Payer: BC Managed Care – PPO

## 2011-07-02 ENCOUNTER — Encounter (HOSPITAL_COMMUNITY): Payer: BC Managed Care – PPO

## 2011-07-05 ENCOUNTER — Encounter (HOSPITAL_COMMUNITY): Payer: BC Managed Care – PPO

## 2011-07-07 ENCOUNTER — Encounter (HOSPITAL_COMMUNITY)
Admission: RE | Admit: 2011-07-07 | Discharge: 2011-07-07 | Disposition: A | Discharge status: Home / Self Care | Payer: BC Managed Care – PPO | Source: Ambulatory Visit | Attending: Cardiology | Admitting: Cardiology

## 2011-07-09 ENCOUNTER — Encounter (HOSPITAL_COMMUNITY): Payer: BC Managed Care – PPO

## 2011-07-09 NOTE — Telephone Encounter (Signed)
F/U from previous call  Discuss plavix. Should patient need to be taken or not.

## 2011-07-12 ENCOUNTER — Encounter (HOSPITAL_COMMUNITY): Payer: BC Managed Care – PPO

## 2011-07-12 NOTE — Telephone Encounter (Signed)
Had called wanting to know since she had heart surgery did she need to stay on Plavix. Per Dr. Swaziland since she had angioplasty did not have to continue Plavix.

## 2011-07-14 ENCOUNTER — Encounter (HOSPITAL_COMMUNITY): Payer: BC Managed Care – PPO

## 2011-07-16 ENCOUNTER — Encounter (HOSPITAL_COMMUNITY): Payer: BC Managed Care – PPO

## 2011-07-19 ENCOUNTER — Encounter (HOSPITAL_COMMUNITY): Payer: BC Managed Care – PPO

## 2011-07-21 ENCOUNTER — Encounter (HOSPITAL_COMMUNITY)
Admission: RE | Admit: 2011-07-21 | Discharge: 2011-07-21 | Disposition: A | Discharge status: Home / Self Care | Payer: BC Managed Care – PPO | Source: Ambulatory Visit | Attending: Cardiology | Admitting: Cardiology

## 2011-07-21 DIAGNOSIS — Z951 Presence of aortocoronary bypass graft: Secondary | ICD-10-CM | POA: Insufficient documentation

## 2011-07-21 DIAGNOSIS — I2510 Atherosclerotic heart disease of native coronary artery without angina pectoris: Secondary | ICD-10-CM | POA: Insufficient documentation

## 2011-07-21 DIAGNOSIS — Z5189 Encounter for other specified aftercare: Secondary | ICD-10-CM | POA: Insufficient documentation

## 2011-07-23 ENCOUNTER — Encounter (HOSPITAL_COMMUNITY): Payer: BC Managed Care – PPO

## 2011-07-26 ENCOUNTER — Encounter (HOSPITAL_COMMUNITY): Payer: BC Managed Care – PPO

## 2011-07-28 ENCOUNTER — Encounter (HOSPITAL_COMMUNITY): Payer: BC Managed Care – PPO

## 2011-07-30 ENCOUNTER — Encounter (HOSPITAL_COMMUNITY): Payer: BC Managed Care – PPO

## 2011-07-30 NOTE — Progress Notes (Signed)
Cardiac Rehabilitation Program Progress Report   Orientation:  06/17/2011 Graduate Date:  tbd Discharge Date:  tbd # of sessions completed: 3  Cardiologist: Peter Swaziland Family MD:  Herb Grays Class Time:  11:00  A.  Exercise Program:  Tolerates exercise @ 2.3 METS for 15 minutes  B.  Mental Health:  Good mental attitude  C.  Education/Instruction/Skills  Knows THR for exercise and Uses Perceived Exertion Scale and/or Dyspnea Scale  Uses Perceived Exertion Scale and/or Dyspnea Scale  D.  Nutrition/Weight Control/Body Composition:  Adherence to prescribed nutrition program: good   *This section completed by Mickle Plumb, Andres Shad, RD, LDN, CDE  E.  Blood Lipids    Lab Results  Component Value Date   CHOL  Value: 164        ATP III CLASSIFICATION:  <200     mg/dL   Desirable  409-811  mg/dL   Borderline High  >=914    mg/dL   High        01/17/2955     Lab Results  Component Value Date   TRIG 155* 12/18/2008     Lab Results  Component Value Date   HDL 29* 12/18/2008     Lab Results  Component Value Date   CHOLHDL 5.7 12/18/2008     No results found for this basename: LDLDIRECT      F.  Lifestyle Changes:  Making positive lifestyle changes  G.  Symptoms noted with exercise:  Asymptomatic  Report Completed By:  Angelica Pou   Comments:  This is patients 1st week report. She achieved a peak Mets of 2.3. Her resting HR is 87 and her resting BP is 142/70. Her peak HR is 98 and her peak BP is 108/60. She is motivated to exercise. A Halfway report will follow.

## 2011-08-02 ENCOUNTER — Encounter (HOSPITAL_COMMUNITY): Payer: BC Managed Care – PPO

## 2011-08-04 ENCOUNTER — Encounter (HOSPITAL_COMMUNITY): Payer: BC Managed Care – PPO

## 2011-08-06 ENCOUNTER — Encounter (HOSPITAL_COMMUNITY): Payer: BC Managed Care – PPO

## 2011-08-09 ENCOUNTER — Encounter (HOSPITAL_COMMUNITY)
Admission: RE | Admit: 2011-08-09 | Discharge: 2011-08-09 | Disposition: A | Discharge status: Home / Self Care | Payer: BC Managed Care – PPO | Source: Ambulatory Visit | Attending: Cardiology | Admitting: Cardiology

## 2011-08-09 ENCOUNTER — Other Ambulatory Visit: Payer: BC Managed Care – PPO | Admitting: *Deleted

## 2011-08-10 ENCOUNTER — Other Ambulatory Visit: Payer: BC Managed Care – PPO | Admitting: *Deleted

## 2011-08-11 ENCOUNTER — Encounter: Payer: Self-pay | Admitting: Cardiology

## 2011-08-11 ENCOUNTER — Encounter (HOSPITAL_COMMUNITY): Payer: BC Managed Care – PPO

## 2011-08-11 ENCOUNTER — Ambulatory Visit (INDEPENDENT_AMBULATORY_CARE_PROVIDER_SITE_OTHER): Payer: BC Managed Care – PPO | Admitting: Cardiology

## 2011-08-11 VITALS — BP 118/70 | HR 62 | Ht 67.0 in | Wt 171.0 lb

## 2011-08-11 DIAGNOSIS — I2510 Atherosclerotic heart disease of native coronary artery without angina pectoris: Secondary | ICD-10-CM

## 2011-08-11 DIAGNOSIS — Z951 Presence of aortocoronary bypass graft: Secondary | ICD-10-CM

## 2011-08-11 DIAGNOSIS — E785 Hyperlipidemia, unspecified: Secondary | ICD-10-CM

## 2011-08-11 DIAGNOSIS — I10 Essential (primary) hypertension: Secondary | ICD-10-CM

## 2011-08-11 DIAGNOSIS — F17200 Nicotine dependence, unspecified, uncomplicated: Secondary | ICD-10-CM

## 2011-08-11 DIAGNOSIS — Z720 Tobacco use: Secondary | ICD-10-CM

## 2011-08-11 LAB — HEPATIC FUNCTION PANEL
ALT: 21 U/L (ref 0–35)
Alkaline Phosphatase: 65 U/L (ref 39–117)
Bilirubin, Direct: 0.1 mg/dL (ref 0.0–0.3)
Total Bilirubin: 0.2 mg/dL — ABNORMAL LOW (ref 0.3–1.2)
Total Protein: 6.7 g/dL (ref 6.0–8.3)

## 2011-08-11 LAB — LIPID PANEL
Cholesterol: 84 mg/dL (ref 0–200)
VLDL: 20.8 mg/dL (ref 0.0–40.0)

## 2011-08-11 LAB — BASIC METABOLIC PANEL
BUN: 13 mg/dL (ref 6–23)
Creatinine, Ser: 0.8 mg/dL (ref 0.4–1.2)
GFR: 78.43 mL/min (ref >60.00)
Glucose, Bld: 102 mg/dL — ABNORMAL HIGH (ref 70–99)
Potassium: 4.2 mEq/L (ref 3.5–5.1)

## 2011-08-11 NOTE — Assessment & Plan Note (Signed)
Blood pressure is well controlled on medication. 

## 2011-08-11 NOTE — Assessment & Plan Note (Signed)
We will check a fasting lipid panel today and adjust medication as necessary to achieve a target LDL of less than 70.

## 2011-08-11 NOTE — Progress Notes (Signed)
[name] Date of Birth: June 05, 1956 Medical Record #[id number]  History of Present Illness: [name] is seen today for a followup visit. [name] has had CABG x 4 in October. Her left internal mammary artery was harvested as a free graft but not able to be used due to small size secondary to subclavian stenosis. [name] did have a saphenous vein graft to the right coronary, saphenous vein graft to the distal LAD, saphenous vein graft to the first diagonal, saphenous vein graft to the obtuse marginal vessel. Her other problems include ongoing tobacco abuse, hypothyroidism and hyperlipidemia.  [name] has had minimal symptoms of chest discomfort. [name] has been sporadic in her followup with cardiac rehabilitation. [name] continues to smoke despite a desire to quit. Of note, all of her hospitalization records from October not in [place].  Current Outpatient Prescriptions on File Prior to Visit  Medication Sig Dispense Refill  . aspirin 325 MG EC tablet Take 325 mg by mouth daily.        Marland Kitchen escitalopram (LEXAPRO) 20 MG tablet Take 20 mg by mouth daily.        . eszopiclone (LUNESTA) 2 MG TABS Take 2 mg by mouth as needed. Take immediately before bedtime  for sleep      . levothyroxine (SYNTHROID, LEVOTHROID) 25 MCG tablet Take 25 mcg by mouth daily.        . metFORMIN (GLUCOPHAGE-XR) 500 MG 24 hr tablet Take 1 tablet by mouth Daily.      . metoprolol succinate (TOPROL-XL) 25 MG 24 hr tablet Take 1 tablet (25 mg total) by mouth daily.  30 tablet  4  . nitroGLYCERIN (NITROSTAT) 0.4 MG SL tablet Place 1 tablet (0.4 mg total) under the tongue every 5 (five) minutes as needed.  25 tablet  11  . rosuvastatin (CRESTOR) 20 MG tablet Take 20 mg by mouth daily.        Marland Kitchen thyroid (ARMOUR) 60 MG tablet Take 90 mg by mouth daily.       . traZODone (DESYREL) 100 MG tablet Take 100-200 mg by mouth at bedtime. Take 1 tablet if it doesn't work take 2 tablets        Allergies  Allergen Reactions  . Darvocet (Propoxyphene  N-Acetaminophen) Nausea And Vomiting  . Erythromycin Nausea And Vomiting    Past Medical History  Diagnosis Date  . Hypertension   . Coronary artery disease 12-2008    s/p Inferior NSTEMI with PCI to the RCA in 2010; s/p CABG in October 2012  . MI (myocardial infarction) 12-2008  . Carotid arterial disease     RIGHT CAROTID BRUIT WITH 40-60% STENOSIS BILATERALLY  . Dyslipidemia   . Polysubstance abuse     WITH TOBACCO, ALCOHOL, AND CRACK COCAINE. NOW DRUG FREE FOR THE PAST YEAR  . Tobacco abuse   . PVD (peripheral vascular disease)     L subclavian stenosis  . Diabetes mellitus     Past Surgical History  Procedure Date  . Coronary angioplasty 12/2008    RIGHT CORONARY   . Cervical fusion   . Removal of colon polyps   . Vaginal hysterectomy   . S/p cabg x 4 May 10, 2011    Per Dr. Maren Beach; SVG to RCA, SVG to distal LAD, SVG to 1st DX and SVG to OM; The LIMA was harvested as a free graft but not used due to the small size secondary to subclavian stenosis    History  Smoking status  . Current Everyday  Smoker -- 0.0 packs/day for 35 years  . Types: Cigarettes  Smokeless tobacco  . Not on file    History  Alcohol Use No    Family History  Problem Relation Age of Onset  . Coronary artery disease Mother   . Heart disease Mother     Review of Systems: The review of systems is per the HPI.  All other systems were reviewed and are negative.  Physical Exam: BP 118/70  Pulse 62  Ht 5\' 7"  (1.702 m)  Wt 171 lb (77.565 kg)  BMI 26.78 kg/m2 Patient is alert and in no acute distress. Skin is weathered and dry. Color is normal.  HEENT is unremarkable. Normocephalic/atraumatic. PERRL. Sclera are nonicteric. Neck is supple. No masses. No JVD. Lungs are fairly clear. Cardiac exam shows a regular rate and rhythm. Abdomen is soft. Extremities are without edema. Gait and ROM are intact. No gross neurologic deficits noted.   LABORATORY DATA:     Assessment / Plan:

## 2011-08-11 NOTE — Patient Instructions (Signed)
We will check fasting lab work today.  Stay on your current medications.  Keep trying to quit smoking.  I will see you again in 6 months.

## 2011-08-11 NOTE — Assessment & Plan Note (Signed)
She is doing well status post CABG. She has healed completely. The major focus going forward is going to be on risk factor modification.

## 2011-08-11 NOTE — Assessment & Plan Note (Signed)
I have encouraged her in her efforts for smoking cessation.

## 2011-08-13 ENCOUNTER — Encounter (HOSPITAL_COMMUNITY)
Admission: RE | Admit: 2011-08-13 | Discharge: 2011-08-13 | Disposition: A | Discharge status: Home / Self Care | Payer: BC Managed Care – PPO | Source: Ambulatory Visit | Attending: Cardiology | Admitting: Cardiology

## 2011-08-13 DIAGNOSIS — I2510 Atherosclerotic heart disease of native coronary artery without angina pectoris: Secondary | ICD-10-CM | POA: Insufficient documentation

## 2011-08-13 DIAGNOSIS — Z5189 Encounter for other specified aftercare: Secondary | ICD-10-CM | POA: Insufficient documentation

## 2011-08-13 DIAGNOSIS — Z951 Presence of aortocoronary bypass graft: Secondary | ICD-10-CM | POA: Insufficient documentation

## 2011-08-16 ENCOUNTER — Encounter (HOSPITAL_COMMUNITY)
Admission: RE | Admit: 2011-08-16 | Discharge: 2011-08-16 | Disposition: A | Discharge status: Home / Self Care | Payer: BC Managed Care – PPO | Source: Ambulatory Visit | Attending: Cardiology | Admitting: Cardiology

## 2011-08-18 ENCOUNTER — Encounter (HOSPITAL_COMMUNITY)
Admission: RE | Admit: 2011-08-18 | Discharge: 2011-08-18 | Disposition: A | Discharge status: Home / Self Care | Payer: BC Managed Care – PPO | Source: Ambulatory Visit | Attending: Cardiology | Admitting: Cardiology

## 2011-08-20 ENCOUNTER — Encounter (HOSPITAL_COMMUNITY): Payer: BC Managed Care – PPO

## 2011-08-23 ENCOUNTER — Encounter (HOSPITAL_COMMUNITY): Payer: BC Managed Care – PPO

## 2011-08-25 ENCOUNTER — Encounter (HOSPITAL_COMMUNITY): Payer: BC Managed Care – PPO

## 2011-08-27 ENCOUNTER — Encounter (HOSPITAL_COMMUNITY): Payer: BC Managed Care – PPO

## 2011-08-30 ENCOUNTER — Encounter (HOSPITAL_COMMUNITY): Payer: BC Managed Care – PPO

## 2011-09-01 ENCOUNTER — Encounter (HOSPITAL_COMMUNITY): Payer: BC Managed Care – PPO

## 2011-09-03 ENCOUNTER — Other Ambulatory Visit: Payer: Self-pay

## 2011-09-03 ENCOUNTER — Encounter (HOSPITAL_COMMUNITY): Payer: BC Managed Care – PPO

## 2011-09-03 MED ORDER — METOPROLOL SUCCINATE ER 25 MG PO TB24: 25.0000 mg | ORAL_TABLET | Freq: Every day | ORAL | Status: DC

## 2011-09-06 ENCOUNTER — Encounter (HOSPITAL_COMMUNITY): Payer: BC Managed Care – PPO

## 2011-09-08 ENCOUNTER — Encounter (HOSPITAL_COMMUNITY): Payer: BC Managed Care – PPO

## 2011-09-10 ENCOUNTER — Encounter (HOSPITAL_COMMUNITY): Payer: BC Managed Care – PPO

## 2011-09-13 ENCOUNTER — Encounter (HOSPITAL_COMMUNITY): Payer: BC Managed Care – PPO

## 2011-09-15 ENCOUNTER — Encounter (HOSPITAL_COMMUNITY): Payer: BC Managed Care – PPO

## 2011-09-17 ENCOUNTER — Encounter (HOSPITAL_COMMUNITY): Payer: BC Managed Care – PPO

## 2011-09-20 ENCOUNTER — Encounter (HOSPITAL_COMMUNITY): Payer: BC Managed Care – PPO | Attending: Cardiology

## 2011-09-20 DIAGNOSIS — Z5189 Encounter for other specified aftercare: Secondary | ICD-10-CM | POA: Insufficient documentation

## 2011-09-20 DIAGNOSIS — I2510 Atherosclerotic heart disease of native coronary artery without angina pectoris: Secondary | ICD-10-CM | POA: Insufficient documentation

## 2011-09-20 DIAGNOSIS — Z951 Presence of aortocoronary bypass graft: Secondary | ICD-10-CM | POA: Insufficient documentation

## 2011-09-22 ENCOUNTER — Encounter (HOSPITAL_COMMUNITY): Payer: BC Managed Care – PPO

## 2011-09-24 ENCOUNTER — Encounter (HOSPITAL_COMMUNITY): Payer: BC Managed Care – PPO

## 2011-09-27 ENCOUNTER — Encounter (HOSPITAL_COMMUNITY): Payer: BC Managed Care – PPO

## 2011-09-29 ENCOUNTER — Encounter (HOSPITAL_COMMUNITY): Payer: BC Managed Care – PPO

## 2011-10-01 ENCOUNTER — Encounter (HOSPITAL_COMMUNITY): Payer: BC Managed Care – PPO

## 2011-10-01 NOTE — Progress Notes (Signed)
Cardiac Rehabilitation Program Progress Report   Orientation:  06/17/2011 Graduate Date:  tbd Discharge Date:  08/18/2011 # of sessions completed: 9  Cardiologist: Christianne Borrow Family MD:  Herb Grays Class Time:  11:00  A.  Exercise Program:  Tolerates exercise @ 2.5 METS for 15 minutes and Discharged  B.  Mental Health:  Good mental attitude  C.  Education/Instruction/Skills  Knows THR for exercise and Uses Perceived Exertion Scale and/or Dyspnea Scale  Uses Perceived Exertion Scale and/or Dyspnea Scale  D.  Nutrition/Weight Control/Body Composition:  Adherence to prescribed nutrition program: good   *This section completed by Mickle Plumb, Andres Shad, RD, LDN, CDE  E.  Blood Lipids    Lab Results  Component Value Date   CHOL 84 08/11/2011     Lab Results  Component Value Date   TRIG 104.0 08/11/2011     Lab Results  Component Value Date   HDL 32.40* 08/11/2011     Lab Results  Component Value Date   CHOLHDL 3 08/11/2011     No results found for this basename: LDLDIRECT      F.  Lifestyle Changes:  Making positive lifestyle changes  G.  Symptoms noted with exercise:  Asymptomatic  Report Completed By:  Angelica Pou   Comments:  Patient did ( sessions then stopped due to family problems. She achieved a peak Mets of 2.5. Her resting HR is 70 and her resting BP is 110/70. Her Peak HR is 96 and Her BP is 160/82. She did well while in rehab.

## 2011-10-04 ENCOUNTER — Encounter (HOSPITAL_COMMUNITY): Payer: BC Managed Care – PPO

## 2011-10-06 ENCOUNTER — Encounter (HOSPITAL_COMMUNITY): Payer: BC Managed Care – PPO

## 2011-10-08 ENCOUNTER — Encounter (HOSPITAL_COMMUNITY): Payer: BC Managed Care – PPO

## 2011-10-11 ENCOUNTER — Encounter (HOSPITAL_COMMUNITY): Payer: BC Managed Care – PPO

## 2011-10-13 ENCOUNTER — Encounter (HOSPITAL_COMMUNITY): Payer: BC Managed Care – PPO

## 2011-10-15 ENCOUNTER — Encounter (HOSPITAL_COMMUNITY): Payer: BC Managed Care – PPO

## 2011-10-18 ENCOUNTER — Encounter (HOSPITAL_COMMUNITY): Payer: BC Managed Care – PPO

## 2011-10-20 ENCOUNTER — Encounter (HOSPITAL_COMMUNITY): Payer: BC Managed Care – PPO

## 2011-10-22 ENCOUNTER — Encounter (HOSPITAL_COMMUNITY): Payer: BC Managed Care – PPO

## 2011-10-25 ENCOUNTER — Encounter (HOSPITAL_COMMUNITY): Payer: BC Managed Care – PPO

## 2011-10-27 ENCOUNTER — Encounter (HOSPITAL_COMMUNITY): Payer: BC Managed Care – PPO

## 2011-10-29 ENCOUNTER — Encounter (HOSPITAL_COMMUNITY): Payer: BC Managed Care – PPO

## 2011-11-01 ENCOUNTER — Encounter (HOSPITAL_COMMUNITY): Payer: BC Managed Care – PPO

## 2011-11-03 ENCOUNTER — Encounter (HOSPITAL_COMMUNITY): Payer: BC Managed Care – PPO

## 2012-03-30 ENCOUNTER — Other Ambulatory Visit: Payer: Self-pay | Admitting: Cardiology

## 2012-03-30 MED ORDER — METOPROLOL SUCCINATE ER 25 MG PO TB24: 25.0000 mg | ORAL_TABLET | Freq: Every day | ORAL | Status: DC

## 2012-07-03 IMAGING — CR DG CHEST 2V
2 series · 2 of 2 positions shown · non-contrast
Comparison: 05/12/2011

CLINICAL DATA: Coronary bypass, postoperative exam

CHEST - 2 VIEW

[w chest pa]
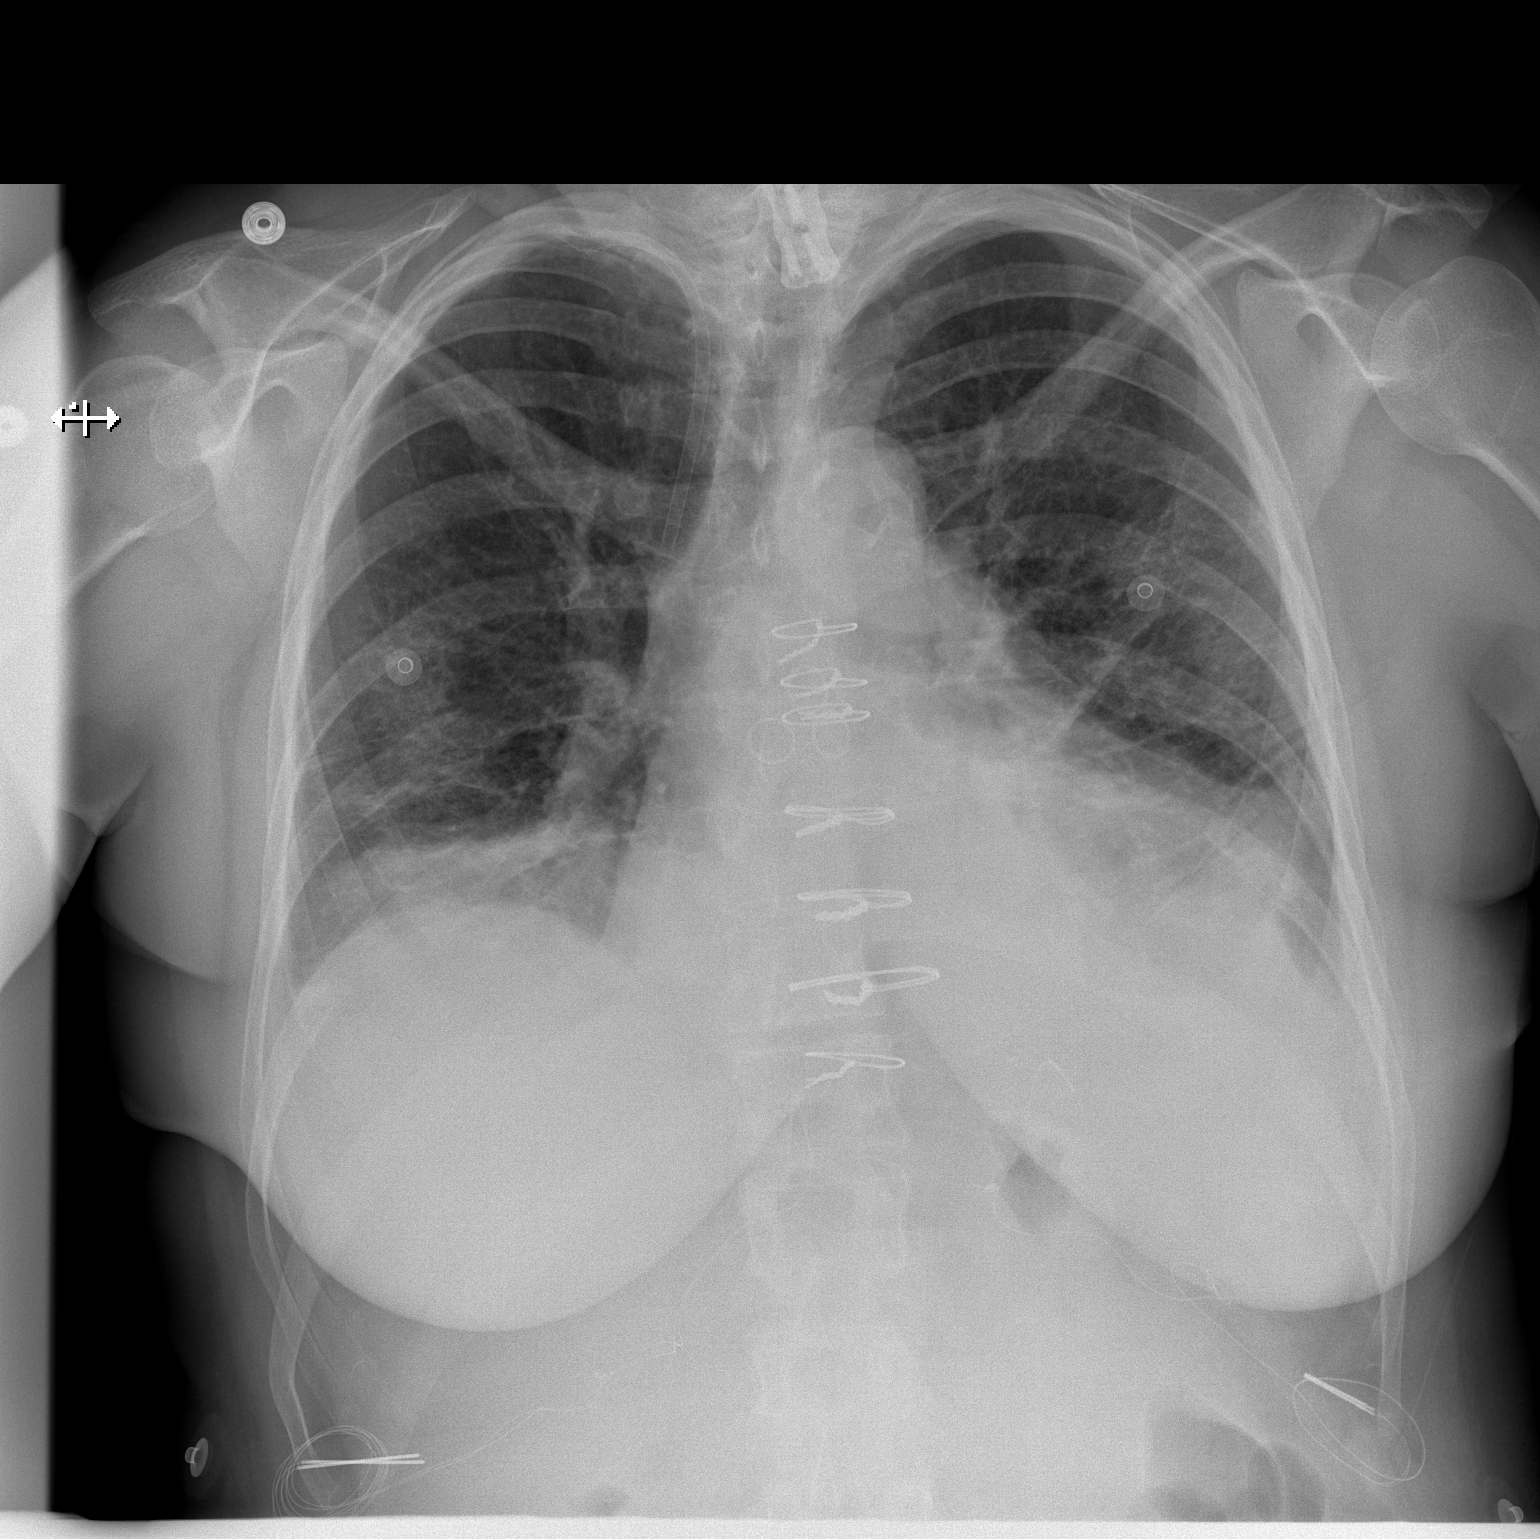

[w chest lat]
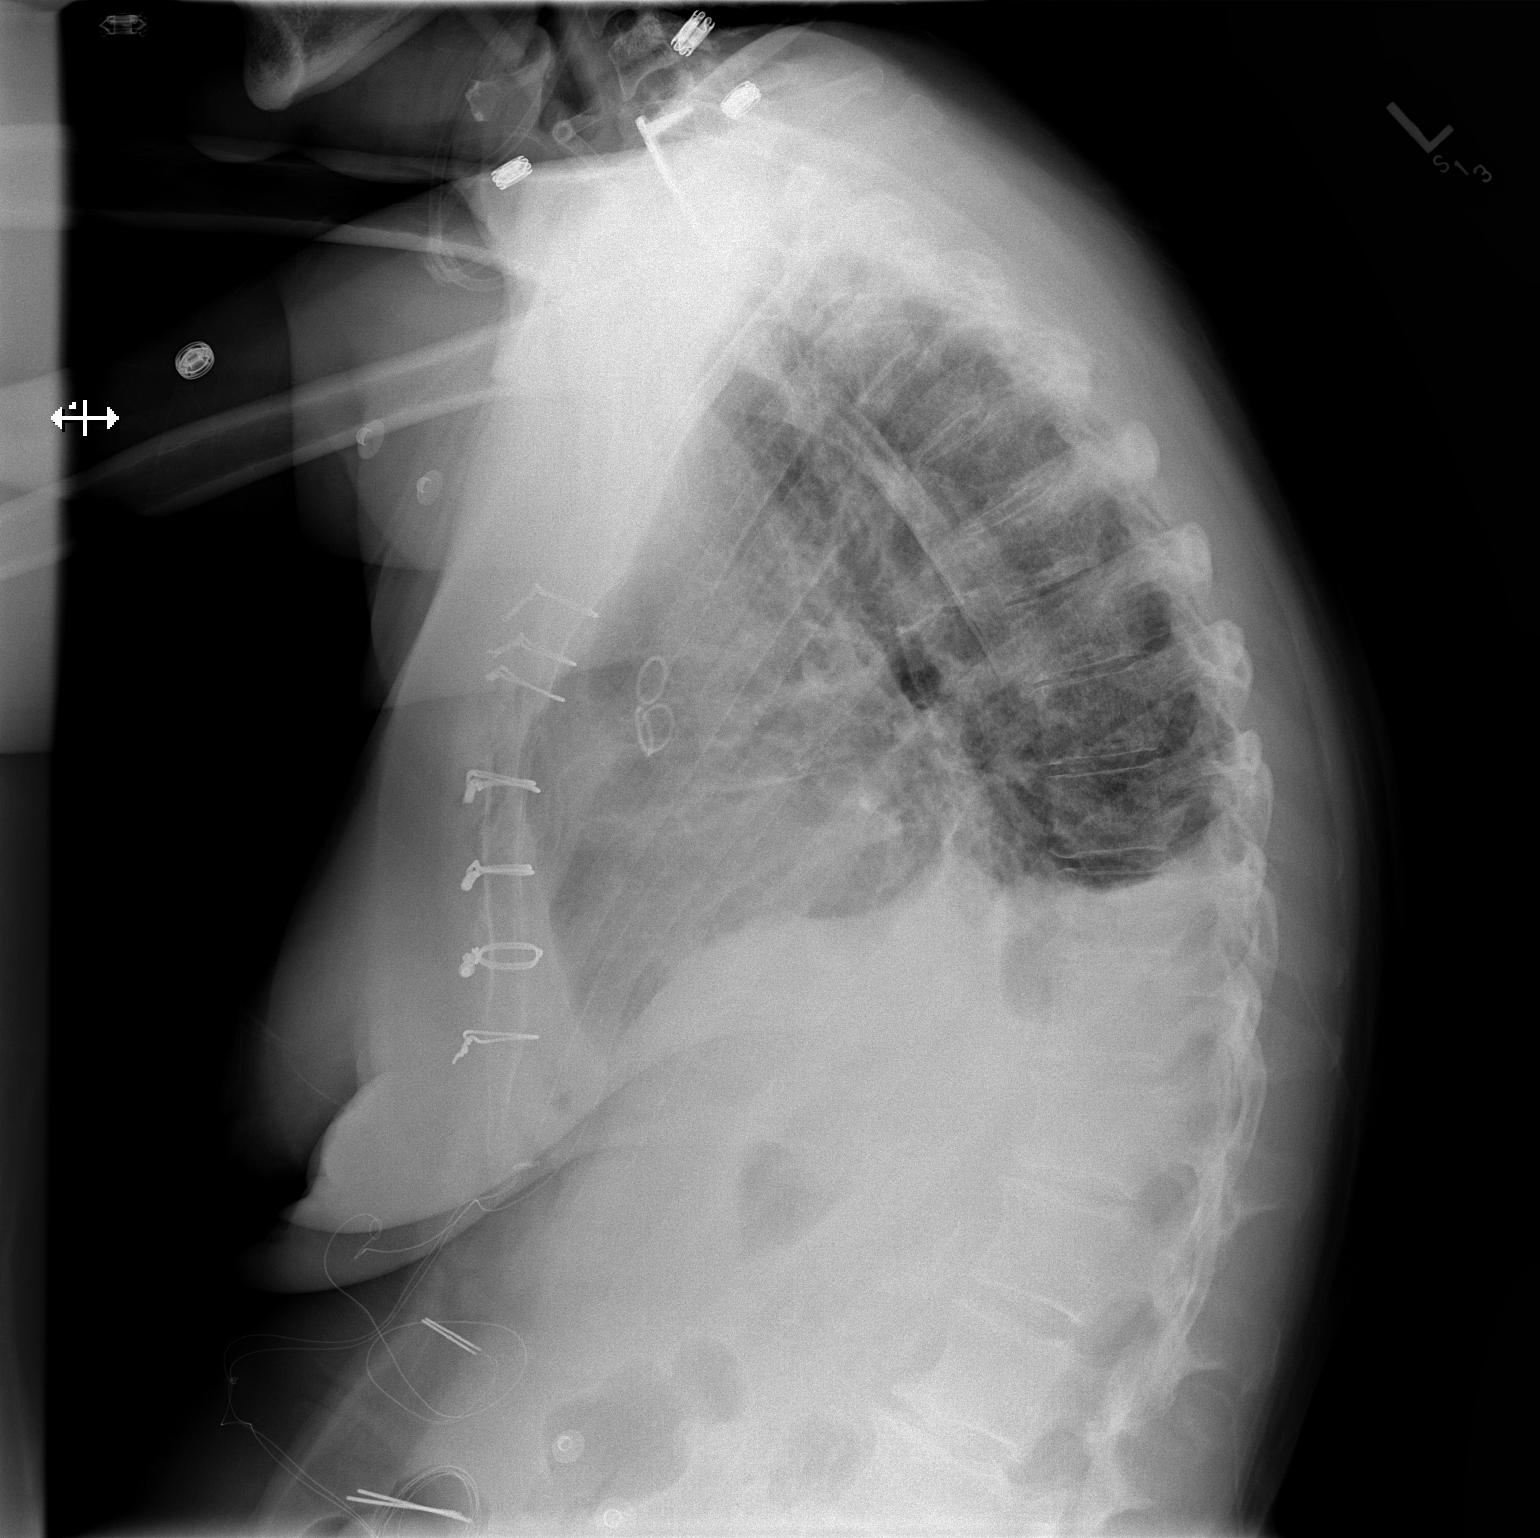

[2 of 2 positions shown; findings below may reference images not displayed]

FINDINGS: Previous coronary bypass changes noted.  Epicardial
pacing wires remain.  Stable right IJ vascular sheath with the tip
positioned at the SVC azygos junction.  Remote lower cervical
fusion hardware noted.  Cardiac silhouette remains enlarged with
bibasilar atelectasis and small effusions.  These findings are
worse in the left lower lobe.  No pneumothorax evident.  Trachea is
midline.  Small amount of left chest subcutaneous emphysema along
the left pectoralis muscle.
IMPRESSION: Cardiomegaly with residual bibasilar atelectasis and small
effusions.

## 2012-10-13 ENCOUNTER — Other Ambulatory Visit: Payer: Self-pay

## 2012-10-13 MED ORDER — METOPROLOL SUCCINATE ER 25 MG PO TB24: 25.0000 mg | ORAL_TABLET | Freq: Every day | ORAL | Status: DC

## 2012-10-31 ENCOUNTER — Ambulatory Visit (INDEPENDENT_AMBULATORY_CARE_PROVIDER_SITE_OTHER): Payer: PRIVATE HEALTH INSURANCE | Admitting: Nurse Practitioner

## 2012-10-31 ENCOUNTER — Encounter: Payer: Self-pay | Admitting: Nurse Practitioner

## 2012-10-31 VITALS — BP 110/78 | HR 100 | Ht 67.0 in | Wt 166.8 lb

## 2012-10-31 DIAGNOSIS — I2510 Atherosclerotic heart disease of native coronary artery without angina pectoris: Secondary | ICD-10-CM

## 2012-10-31 LAB — LIPID PANEL
Cholesterol: 119 mg/dL (ref 0–200)
HDL: 31.8 mg/dL — ABNORMAL LOW
LDL Cholesterol: 56 mg/dL (ref 0–99)
Total CHOL/HDL Ratio: 4
Triglycerides: 157 mg/dL — ABNORMAL HIGH (ref 0.0–149.0)
VLDL: 31.4 mg/dL (ref 0.0–40.0)

## 2012-10-31 LAB — HEPATIC FUNCTION PANEL
ALT: 37 U/L — ABNORMAL HIGH (ref 0–35)
AST: 50 U/L — ABNORMAL HIGH (ref 0–37)
Albumin: 3.8 g/dL (ref 3.5–5.2)
Alkaline Phosphatase: 75 U/L (ref 39–117)
Bilirubin, Direct: 0.1 mg/dL (ref 0.0–0.3)
Total Bilirubin: 0.7 mg/dL (ref 0.3–1.2)
Total Protein: 6.9 g/dL (ref 6.0–8.3)

## 2012-10-31 LAB — BASIC METABOLIC PANEL WITH GFR
BUN: 16 mg/dL (ref 6–23)
CO2: 31 meq/L (ref 19–32)
Calcium: 8.9 mg/dL (ref 8.4–10.5)
Chloride: 100 meq/L (ref 96–112)
Creatinine, Ser: 0.8 mg/dL (ref 0.4–1.2)
GFR: 78.07 mL/min
Glucose, Bld: 181 mg/dL — ABNORMAL HIGH (ref 70–99)
Potassium: 3.2 meq/L — ABNORMAL LOW (ref 3.5–5.1)
Sodium: 139 meq/L (ref 135–145)

## 2012-10-31 MED ORDER — METOPROLOL SUCCINATE ER 25 MG PO TB24: 25.0000 mg | ORAL_TABLET | Freq: Every day | ORAL | Status: DC

## 2012-10-31 MED ORDER — ROSUVASTATIN CALCIUM 20 MG PO TABS: 20.0000 mg | ORAL_TABLET | Freq: Every day | ORAL | Status: DC

## 2012-10-31 NOTE — Patient Instructions (Addendum)
I have refilled the Metoprolol and your Crestor today  We need to check labs today  Try to work on stopping smoking  Dr. Swaziland will see you in one year  Call the [facility] office at [phone] if you have any questions, problems or concerns.

## 2012-10-31 NOTE — Progress Notes (Signed)
[name] Date of Birth: Nov 26, 1955 Medical Record #[id number]  History of Present Illness: [name] is seen back today for a follow up visit. She is seen for Dr. Swaziland. She was last seen here in January of 2013. She has ongoing tobacco abuse, known CAD prior inferior MI with PCI to the [facility] in 2010, with CABG in October of 2012. LIMA was harvested as a free graft but not able to be used due to small size secondary to subclavian stenosis. She had a SVG to the RCA, SVG to the distal LAD, SVG to the first DX and SVG to the OM. Other issues include sleep disorder, DM, hypothyroidism, bilateral carotid disease, past polysubstance drug abuse and HLD.  Last seen in January of 2013. Was doing ok clinically.   She comes back today. She is here alone. Doing ok. Still smoking. No chest pain. BP looks ok. She has been out of her metoprolol for several days and crestor for several weeks.   Current Outpatient Prescriptions on File Prior to Visit  Medication Sig Dispense Refill  . aspirin 325 MG EC tablet Take 325 mg by mouth daily.        Marland Kitchen escitalopram (LEXAPRO) 20 MG tablet Take 20 mg by mouth daily.        . eszopiclone (LUNESTA) 2 MG TABS Take 2 mg by mouth as needed. Take immediately before bedtime  for sleep      . metFORMIN (GLUCOPHAGE-XR) 500 MG 24 hr tablet Take 1 tablet by mouth Daily.      . nitroGLYCERIN (NITROSTAT) 0.4 MG SL tablet Place 1 tablet (0.4 mg total) under the tongue every 5 (five) minutes as needed.  25 tablet  11  . thyroid (ARMOUR) 60 MG tablet Take 90 mg by mouth daily.        No current facility-administered medications on file prior to visit.    Allergies  Allergen Reactions  . Darvocet (Propoxyphene-Acetaminophen) Nausea And Vomiting  . Erythromycin Nausea And Vomiting    Past Medical History  Diagnosis Date  . Hypertension   . Coronary artery disease 12-2008    s/p Inferior NSTEMI with PCI to the RCA in 2010; s/p CABG in October 2012  . MI (myocardial  infarction) 12-2008  . Carotid arterial disease     RIGHT CAROTID BRUIT WITH 40-60% STENOSIS BILATERALLY  . Dyslipidemia   . Polysubstance abuse     WITH TOBACCO, ALCOHOL, AND CRACK COCAINE. NOW DRUG FREE FOR THE PAST YEAR  . Tobacco abuse   . PVD (peripheral vascular disease)     L subclavian stenosis  . Diabetes mellitus     Past Surgical History  Procedure Laterality Date  . Coronary angioplasty  12/2008    RIGHT CORONARY   . Cervical fusion    . Removal of colon polyps    . Vaginal hysterectomy    . S/p cabg x 4  May 10, 2011    Per Dr. Maren Beach; SVG to RCA, SVG to distal LAD, SVG to 1st DX and SVG to OM; The LIMA was harvested as a free graft but not used due to the small size secondary to subclavian stenosis    History  Smoking status  . Current Every Day Smoker -- 0.05 packs/day for 35 years  . Types: Cigarettes  Smokeless tobacco  . Not on file    History  Alcohol Use No    Family History  Problem Relation Age of Onset  . Coronary artery disease Mother   .  Heart disease Mother     Review of Systems: The review of systems is per the HPI.  All other systems were reviewed and are negative.  Physical Exam: BP 110/78  Pulse 100  Ht 5\' 7"  (1.702 m)  Wt 166 lb 12.8 oz (75.66 kg)  BMI 26.12 kg/m2 Patient is very pleasant and in no acute distress. Skin is warm and dry. Color is normal.  HEENT is unremarkable. Normocephalic/atraumatic. PERRL. Sclera are nonicteric. Neck is supple. No masses. No JVD. Lungs are clear. Cardiac exam shows a regular rate and rhythm. Rate is a little fast today. Abdomen is soft. Extremities are without edema. Gait and ROM are intact. No gross neurologic deficits noted.  LABORATORY DATA: Pending  Lab Results  Component Value Date   WBC 8.9 06/11/2011   HGB 11.8* 06/11/2011   HCT 35.9* 06/11/2011   PLT 400.0 06/11/2011   GLUCOSE 102* 08/11/2011   CHOL 84 08/11/2011   TRIG 104.0 08/11/2011   HDL 32.40* 08/11/2011   LDLCALC 31  08/11/2011   ALT 21 08/11/2011   AST 17 08/11/2011   NA 143 08/11/2011   K 4.2 08/11/2011   CL 108 08/11/2011   CREATININE 0.8 08/11/2011   BUN 13 08/11/2011   CO2 30 08/11/2011   TSH 4.723* 05/13/2011   INR 1.18 05/13/2011   HGBA1C 6.5* 05/07/2011     Assessment / Plan:  1. CAD - past CABG - no symptoms reported. CV risk factor modification encouraged.   2. DM  3. HLD - on statin therapy - needs labs - will check today.  I have refilled the Crestor today.   4. Carotid disease - last duplex in 2012 with 0 to 39% bilaterally. Consider recheck on return.   5. Tobacco abuse - she is counseled today. She is not ready to quit.   We will see her back tentatively in one year. Medicines are refilled today. Labs will be checked. Smoking cessation and general CV risk factor modification encouraged.   Patient is agreeable to this plan and will call if any problems develop in the interim.   Rosalio Macadamia, RN, ANP-C [facility] [address] [place], [place]  [address]

## 2012-11-01 ENCOUNTER — Other Ambulatory Visit: Payer: Self-pay | Admitting: *Deleted

## 2012-11-01 DIAGNOSIS — E876 Hypokalemia: Secondary | ICD-10-CM

## 2012-11-01 MED ORDER — POTASSIUM CHLORIDE CRYS ER 20 MEQ PO TBCR: 20.0000 meq | EXTENDED_RELEASE_TABLET | Freq: Every day | ORAL | Status: AC

## 2012-11-15 ENCOUNTER — Other Ambulatory Visit: Payer: PRIVATE HEALTH INSURANCE

## 2012-11-17 ENCOUNTER — Other Ambulatory Visit (INDEPENDENT_AMBULATORY_CARE_PROVIDER_SITE_OTHER): Payer: PRIVATE HEALTH INSURANCE

## 2012-11-17 DIAGNOSIS — E876 Hypokalemia: Secondary | ICD-10-CM

## 2012-11-17 LAB — BASIC METABOLIC PANEL
BUN: 14 mg/dL (ref 6–23)
CO2: 26 mEq/L (ref 19–32)
Calcium: 9.2 mg/dL (ref 8.4–10.5)
Chloride: 105 mEq/L (ref 96–112)
Creatinine, Ser: 1 mg/dL (ref 0.4–1.2)
GFR: 64.94 mL/min (ref >60.00)
Glucose, Bld: 186 mg/dL — ABNORMAL HIGH (ref 70–99)
Potassium: 3.9 mEq/L (ref 3.5–5.1)
Sodium: 140 mEq/L (ref 135–145)

## 2012-11-17 LAB — HEPATIC FUNCTION PANEL
ALT: 47 U/L — ABNORMAL HIGH (ref 0–35)
AST: 46 U/L — ABNORMAL HIGH (ref 0–37)
Albumin: 3.9 g/dL (ref 3.5–5.2)
Alkaline Phosphatase: 73 U/L (ref 39–117)
Bilirubin, Direct: 0.1 mg/dL (ref 0.0–0.3)
Total Bilirubin: 0.5 mg/dL (ref 0.3–1.2)
Total Protein: 7.1 g/dL (ref 6.0–8.3)

## 2012-11-20 ENCOUNTER — Telehealth: Payer: Self-pay | Admitting: *Deleted

## 2012-11-20 ENCOUNTER — Other Ambulatory Visit: Payer: Self-pay | Admitting: *Deleted

## 2012-11-20 NOTE — Telephone Encounter (Signed)
Routed a copy of labs to Dr. Yehuda Budd per pt request

## 2013-01-01 ENCOUNTER — Other Ambulatory Visit: Payer: PRIVATE HEALTH INSURANCE

## 2013-09-04 ENCOUNTER — Other Ambulatory Visit: Payer: Self-pay

## 2013-09-04 MED ORDER — ROSUVASTATIN CALCIUM 20 MG PO TABS: 20.0000 mg | ORAL_TABLET | Freq: Every day | ORAL | Status: AC

## 2014-01-18 ENCOUNTER — Other Ambulatory Visit: Payer: Self-pay | Admitting: *Deleted

## 2014-01-18 ENCOUNTER — Telehealth: Payer: Self-pay | Admitting: *Deleted

## 2014-01-18 NOTE — Telephone Encounter (Signed)
[facility] is requesting potassium refill from Norma FredricksonLori Gerhardt. Is she still to be taking this? Please advise. Thanks, MI

## 2014-01-18 NOTE — Telephone Encounter (Signed)
S/w Mardelle MatteAndy at [place] [facility] stated already received a script from [employer]ra Cobb for Potassium ( 20 meq ) daily

## 2014-06-28 ENCOUNTER — Encounter: Payer: PRIVATE HEALTH INSURANCE | Admitting: Cardiology

## 2014-07-25 ENCOUNTER — Other Ambulatory Visit: Payer: Self-pay | Admitting: Nurse Practitioner

## 2015-06-29 ENCOUNTER — Emergency Department (HOSPITAL_COMMUNITY)
Admission: EM | Admit: 2015-06-29 | Discharge: 2015-06-29 | Disposition: A | Discharge status: Home / Self Care | Payer: PRIVATE HEALTH INSURANCE | Attending: Emergency Medicine | Admitting: Emergency Medicine

## 2015-06-29 ENCOUNTER — Encounter (HOSPITAL_COMMUNITY): Payer: Self-pay | Admitting: Emergency Medicine

## 2015-06-29 ENCOUNTER — Emergency Department (HOSPITAL_COMMUNITY): Payer: PRIVATE HEALTH INSURANCE

## 2015-06-29 DIAGNOSIS — Y9289 Other specified places as the place of occurrence of the external cause: Secondary | ICD-10-CM | POA: Diagnosis not present

## 2015-06-29 DIAGNOSIS — Z951 Presence of aortocoronary bypass graft: Secondary | ICD-10-CM | POA: Diagnosis not present

## 2015-06-29 DIAGNOSIS — W010XXA Fall on same level from slipping, tripping and stumbling without subsequent striking against object, initial encounter: Secondary | ICD-10-CM | POA: Diagnosis not present

## 2015-06-29 DIAGNOSIS — Y9389 Activity, other specified: Secondary | ICD-10-CM | POA: Diagnosis not present

## 2015-06-29 DIAGNOSIS — E119 Type 2 diabetes mellitus without complications: Secondary | ICD-10-CM | POA: Diagnosis not present

## 2015-06-29 DIAGNOSIS — Z791 Long term (current) use of non-steroidal anti-inflammatories (NSAID): Secondary | ICD-10-CM | POA: Insufficient documentation

## 2015-06-29 DIAGNOSIS — I2510 Atherosclerotic heart disease of native coronary artery without angina pectoris: Secondary | ICD-10-CM | POA: Insufficient documentation

## 2015-06-29 DIAGNOSIS — F17210 Nicotine dependence, cigarettes, uncomplicated: Secondary | ICD-10-CM | POA: Insufficient documentation

## 2015-06-29 DIAGNOSIS — S93401A Sprain of unspecified ligament of right ankle, initial encounter: Secondary | ICD-10-CM | POA: Diagnosis not present

## 2015-06-29 DIAGNOSIS — I252 Old myocardial infarction: Secondary | ICD-10-CM | POA: Insufficient documentation

## 2015-06-29 DIAGNOSIS — Z7982 Long term (current) use of aspirin: Secondary | ICD-10-CM | POA: Insufficient documentation

## 2015-06-29 DIAGNOSIS — Z7984 Long term (current) use of oral hypoglycemic drugs: Secondary | ICD-10-CM | POA: Diagnosis not present

## 2015-06-29 DIAGNOSIS — S99911A Unspecified injury of right ankle, initial encounter: Secondary | ICD-10-CM | POA: Diagnosis present

## 2015-06-29 DIAGNOSIS — I10 Essential (primary) hypertension: Secondary | ICD-10-CM | POA: Diagnosis not present

## 2015-06-29 DIAGNOSIS — Y998 Other external cause status: Secondary | ICD-10-CM | POA: Insufficient documentation

## 2015-06-29 DIAGNOSIS — E785 Hyperlipidemia, unspecified: Secondary | ICD-10-CM | POA: Diagnosis not present

## 2015-06-29 DIAGNOSIS — Z79899 Other long term (current) drug therapy: Secondary | ICD-10-CM | POA: Insufficient documentation

## 2015-06-29 MED ORDER — TRAMADOL HCL 50 MG PO TABS: 50.0000 mg | ORAL_TABLET | Freq: Four times a day (QID) | ORAL | Status: DC | PRN

## 2015-06-29 MED ORDER — FAMOTIDINE 20 MG PO TABS: 20.0000 mg | ORAL_TABLET | Freq: Two times a day (BID) | ORAL | Status: AC

## 2015-06-29 MED ORDER — NAPROXEN 500 MG PO TABS: 500.0000 mg | ORAL_TABLET | Freq: Two times a day (BID) | ORAL | Status: DC

## 2015-06-29 NOTE — ED Notes (Signed)
Patient c/o right ankle pain. Per patient tripped and fell onto ankle. Denies hitting head or LOC.

## 2015-06-29 NOTE — Discharge Instructions (Signed)
Ankle Sprain °An ankle sprain is an injury to the strong, fibrous tissues (ligaments) that hold the bones of your ankle joint together.  °CAUSES °An ankle sprain is usually caused by a fall or by twisting your ankle. Ankle sprains most commonly occur when you step on the outer edge of your foot, and your ankle turns inward. People who participate in sports are more prone to these types of injuries.  °SYMPTOMS  °· Pain in your ankle. The pain may be present at rest or only when you are trying to stand or walk. °· Swelling. °· Bruising. Bruising may develop immediately or within 1 to 2 days after your injury. °· Difficulty standing or walking, particularly when turning corners or changing directions. °DIAGNOSIS  °Your caregiver will ask you details about your injury and perform a physical exam of your ankle to determine if you have an ankle sprain. During the physical exam, your caregiver will press on and apply pressure to specific areas of your foot and ankle. Your caregiver will try to move your ankle in certain ways. An X-ray exam may be done to be sure a bone was not broken or a ligament did not separate from one of the bones in your ankle (avulsion fracture).  °TREATMENT  °Certain types of braces can help stabilize your ankle. Your caregiver can make a recommendation for this. Your caregiver may recommend the use of medicine for pain. If your sprain is severe, your caregiver may refer you to a surgeon who helps to restore function to parts of your skeletal system (orthopedist) or a physical therapist. °HOME CARE INSTRUCTIONS  °· Apply ice to your injury for 1-2 days or as directed by your caregiver. Applying ice helps to reduce inflammation and pain. °¨ Put ice in a plastic bag. °¨ Place a towel between your skin and the bag. °¨ Leave the ice on for 15-20 minutes at a time, every 2 hours while you are awake. °· Only take over-the-counter or prescription medicines for pain, discomfort, or fever as directed by  your caregiver. °· Elevate your injured ankle above the level of your heart as much as possible for 2-3 days. °· If your caregiver recommends crutches, use them as instructed. Gradually put weight on the affected ankle. Continue to use crutches or a cane until you can walk without feeling pain in your ankle. °· If you have a plaster splint, wear the splint as directed by your caregiver. Do not rest it on anything harder than a pillow for the first 24 hours. Do not put weight on it. Do not get it wet. You may take it off to take a shower or bath. °· You may have been given an elastic bandage to wear around your ankle to provide support. If the elastic bandage is too tight (you have numbness or tingling in your foot or your foot becomes cold and blue), adjust the bandage to make it comfortable. °· If you have an air splint, you may blow more air into it or let air out to make it more comfortable. You may take your splint off at night and before taking a shower or bath. Wiggle your toes in the splint several times per day to decrease swelling. °SEEK MEDICAL CARE IF:  °· You have rapidly increasing bruising or swelling. °· Your toes feel extremely cold or you lose feeling in your foot. °· Your pain is not relieved with medicine. °SEEK IMMEDIATE MEDICAL CARE IF: °· Your toes are numb or blue. °·   You have severe pain that is increasing. MAKE SURE YOU:   Understand these instructions.  Will watch your condition.  Will get help right away if you are not doing well or get worse.   This information is not intended to replace advice given to you by your health care provider. Make sure you discuss any questions you have with your health care provider.   Document Released: 06/28/2005 Document Revised: 07/19/2014 Document Reviewed: 07/10/2011 Elsevier Interactive Patient Education 2016 [employer]   Wear the ASO and use crutches to avoid weight bearing.  Use ice and elevation as much as possible for the next  several days to help reduce the swelling.  Take the medication prescribed for pain and swelling.  This will make you drowsy - do not drive within 4 hours of taking this medication.   Call the orthopedic doctor listed for a recheck of your injury in 1 week.  You may benefit from physical therapy of your ankle if it is not getting better over the next week to 10 days.

## 2015-06-29 NOTE — ED Notes (Signed)
Pt not in room.

## 2015-06-29 NOTE — ED Provider Notes (Signed)
CSN: [id number]     Arrival date & time 06/29/15  1011 History  By signing my name below, I, Soijett Blue, attest that this documentation has been prepared under the direction and in the presence of Burgess Amor, PA-C Electronically Signed: Soijett Blue, ED Scribe. 06/29/2015. 11:12 AM.   Chief Complaint  Patient presents with  . Ankle Pain      The history is provided by the patient. No language interpreter was used.    [name] is a 59 y.o. female with a medical hx of HTN, DM who presents to the Emergency Department complaining of constant, moderate, right ankle pain onset last night. She notes that she tripped and fell onto her right ankle while putting christmas lights up in the windows and has had constant pain since. She notes that she is unsure if she inverted the ankle.  Denies prior injury to this joint. Pt is having associated symptoms of joint swelling and gait problem due to pain. She has tried ice and tylenol with no relief of her symptoms. She denies hitting her head, LOC, right knee , hip or other pain. Denies having a PCP at this time due to her PCP office shutting down. She has seen Dr. 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   Past Medical History  Diagnosis Date  . Hypertension   . Coronary artery disease 12-2008    s/p Inferior NSTEMI with PCI to the RCA in 2010; s/p CABG in October 2012  . MI (myocardial infarction) (HCC) 12-2008  . 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 Dyslipidemia   . Polysubstance abuse     WITH TOBACCO, ALCOHOL, AND CRACK COCAINE. NOW DRUG FREE FOR THE PAST YEAR  . Tobacco abuse   . PVD (peripheral vascular disease) (HCC)     L subclavian stenosis  . Diabetes mellitus    Past Surgical History  Procedure Laterality Date  . Coronary angioplasty  12/2008    RIGHT CORONARY   . Cervical fusion    . Removal of colon polyps    . Vaginal hysterectomy    . S/p cabg x 4   May 10, 2011    Per Dr. Maren Beach; SVG to RCA, SVG to distal LAD, SVG to 1st DX and SVG to OM; The LIMA was harvested as a free graft but not used due to the small size secondary to subclavian stenosis   Family History  Problem Relation Age of Onset  . Coronary artery disease Mother   . Heart disease Mother    Social History  Substance Use Topics  . Smoking status: Current Every Day Smoker -- 0.05 packs/day for 35 years    Types: Cigarettes  . Smokeless tobacco: Never Used  . Alcohol Use: No   OB History    Gravida Para Term Preterm AB TAB SAB Ectopic Multiple Living   Review of Systems  Musculoskeletal: Positive for arthralgias and gait problem (due to pain). Negative for joint swelling.  Skin: Negative for color change, rash and wound.      Allergies  Darvocet and Erythromycin  Home Medications   Prior to Admission medications   Medication Sig Start Date End Date Taking? Authorizing Provider  ARIPiprazole (ABILIFY) 2 MG tablet Take 2 mg by mouth daily.    Historical Provider, MD  aspirin 325 MG EC tablet  Take 325 mg by mouth daily.      Historical Provider, MD  diazepam (VALIUM) 10 MG tablet Take 10 mg by mouth at bedtime as needed for anxiety. Take one half to one tablet a day    Historical Provider, MD  escitalopram (LEXAPRO) 20 MG tablet Take 20 mg by mouth daily.      Historical Provider, MD  eszopiclone (LUNESTA) 2 MG TABS Take 2 mg by mouth as needed. Take immediately before bedtime  for sleep    Historical Provider, MD  famotidine (PEPCID) 20 MG tablet Take 1 tablet (20 mg total) by mouth 2 (two) times daily. 06/29/15   Burgess Amor, PA-C  metFORMIN (GLUCOPHAGE-XR) 500 MG 24 hr tablet Take 1 tablet by mouth Daily. 03/29/11   Historical Provider, MD  metoprolol succinate (TOPROL-XL) 25 MG 24 hr tablet TAKE ONE (1) TABLET BY MOUTH EVERY DAY 07/26/14   Peter M [place], MD  naproxen (NAPROSYN) 500 MG tablet Take 1 tablet (500 mg total) by mouth 2 (two)  times daily. 06/29/15   Burgess Amor, PA-C  nitroGLYCERIN (NITROSTAT) 0.4 MG SL tablet Place 1 tablet (0.4 mg total) under the tongue every 5 (five) minutes as needed. 06/11/11   Rosalio Macadamia, NP  potassium chloride SA (K-DUR,KLOR-CON) 20 MEQ tablet Take 1 tablet (20 mEq total) by mouth daily. 11/01/12   Rosalio Macadamia, NP  rosuvastatin (CRESTOR) 20 MG tablet Take 1 tablet (20 mg total) by mouth daily. 09/04/13   Rosalio Macadamia, NP  thyroid (ARMOUR) 60 MG tablet Take 90 mg by mouth daily.     Historical Provider, MD  traMADol (ULTRAM) 50 MG tablet Take 1 tablet (50 mg total) by mouth every 6 (six) hours as needed. 06/29/15   Burgess Amor, PA-C   BP 115/73 mmHg  Pulse 104  Temp(Src) 97.5 F (36.4 C) (Oral)  Resp 18  Ht  (1.702 m)  Wt 68.04 kg  BMI 23.49 kg/m2  SpO2 96% Physical Exam  Constitutional: She is oriented to person, place, and time. She appears well-developed and well-nourished. No distress.  HENT:  Head: Normocephalic and atraumatic.  Eyes: EOM are normal.  Neck: Neck supple.  Cardiovascular: Normal rate.   Pulmonary/Chest: Effort normal. No respiratory distress.  Musculoskeletal: Normal range of motion.       Right ankle: She exhibits swelling. She exhibits no deformity. Tenderness. Lateral malleolus tenderness found. Achilles tendon normal.  Right ankle: achilles tendon intact. Good pulse. Able to wiggle toes. TTP over her right lateral malleolus with mild edema. No bruising. Proximal fibula is non-tender. No obvious deformity.   Neurological: She is alert and oriented to person, place, and time.  Skin: Skin is warm and dry.  Psychiatric: She has a normal mood and affect. Her behavior is normal.  Nursing note and vitals reviewed.   ED Course  Procedures (including critical care time) DIAGNOSTIC STUDIES: Oxygen Saturation is 96% on RA, nl by my interpretation.    COORDINATION OF CARE: 11:11 AM Discussed treatment plan with pt at bedside which includes right ankle  xray, RICE, and crutches and pt agreed to plan.    Labs Review Labs Reviewed - No data to display  Imaging Review Dg Ankle Complete Right  06/29/2015  CLINICAL DATA:  Right ankle pain, mostly lateral, status post fall. Right ankle swelling. EXAM: RIGHT ANKLE - COMPLETE 3+ VIEW COMPARISON:  None. FINDINGS: Osseous alignment is normal. Bone mineralization is normal. No fracture line or displaced fracture fragment identified. Ankle mortise  is symmetric. Soft tissues about the right ankle are unremarkable. Incidental note made of chronic enthesopathy along the dorsal and plantar margins of the posterior calcaneus. IMPRESSION: No osseous fracture or dislocation. Electronically Signed   By: Bary RichardStan  Maynard M.D.   On: 06/29/2015 11:22   I have personally reviewed and evaluated these images as part of my medical decision-making.   EKG Interpretation None      MDM   Final diagnoses:  Ankle sprain, right, initial encounter    RICE,  [place], crutches, naproxen, pepcid (to avoid stomach upset) and tramadol prn pain.  Referral to Dr.Keeling for a recheck in one week if sx worsen or are not improving.  I personally performed the services described in this documentation, which was scribed in my presence. The recorded information has been reviewed and is accurate.   Burgess AmorJulie Shelonda Saxe, PA-C 07/01/15 2033  Glynn OctaveStephen Rancour, MD 07/02/15 [phone]

## 2016-06-04 ENCOUNTER — Encounter (HOSPITAL_COMMUNITY): Payer: Self-pay | Admitting: Emergency Medicine

## 2016-06-04 ENCOUNTER — Emergency Department (HOSPITAL_COMMUNITY)
Admission: EM | Admit: 2016-06-04 | Discharge: 2016-06-04 | Disposition: A | Discharge status: Home / Self Care | Payer: PRIVATE HEALTH INSURANCE | Attending: Emergency Medicine | Admitting: Emergency Medicine

## 2016-06-04 DIAGNOSIS — E119 Type 2 diabetes mellitus without complications: Secondary | ICD-10-CM | POA: Insufficient documentation

## 2016-06-04 DIAGNOSIS — Z7982 Long term (current) use of aspirin: Secondary | ICD-10-CM | POA: Insufficient documentation

## 2016-06-04 DIAGNOSIS — I10 Essential (primary) hypertension: Secondary | ICD-10-CM | POA: Insufficient documentation

## 2016-06-04 DIAGNOSIS — J0100 Acute maxillary sinusitis, unspecified: Secondary | ICD-10-CM

## 2016-06-04 DIAGNOSIS — F17210 Nicotine dependence, cigarettes, uncomplicated: Secondary | ICD-10-CM | POA: Insufficient documentation

## 2016-06-04 DIAGNOSIS — I2510 Atherosclerotic heart disease of native coronary artery without angina pectoris: Secondary | ICD-10-CM | POA: Insufficient documentation

## 2016-06-04 DIAGNOSIS — Z79899 Other long term (current) drug therapy: Secondary | ICD-10-CM | POA: Insufficient documentation

## 2016-06-04 LAB — CBC
HEMATOCRIT: 51.5 % — AB (ref 36.0–46.0)
HEMOGLOBIN: 17.8 g/dL — AB (ref 12.0–15.0)
MCH: 34.7 pg — ABNORMAL HIGH (ref 26.0–34.0)
MCHC: 34.6 g/dL (ref 30.0–36.0)
MCV: 100.4 fL — AB (ref 78.0–100.0)
Platelets: 245 10*3/uL (ref 150–400)
RBC: 5.13 MIL/uL — AB (ref 3.87–5.11)
RDW: 12.2 % (ref 11.5–15.5)
WBC: 14.3 10*3/uL — AB (ref 4.0–10.5)

## 2016-06-04 LAB — COMPREHENSIVE METABOLIC PANEL
ALT: 28 U/L (ref 14–54)
ANION GAP: 8 (ref 5–15)
AST: 24 U/L (ref 15–41)
Albumin: 4.3 g/dL (ref 3.5–5.0)
Alkaline Phosphatase: 72 U/L (ref 38–126)
BILIRUBIN TOTAL: 0.7 mg/dL (ref 0.3–1.2)
BUN: 19 mg/dL (ref 6–20)
CHLORIDE: 103 mmol/L (ref 101–111)
CO2: 27 mmol/L (ref 22–32)
Calcium: 9.8 mg/dL (ref 8.9–10.3)
Creatinine, Ser: 1.22 mg/dL — ABNORMAL HIGH (ref 0.44–1.00)
GFR calc Af Amer: 55 mL/min — ABNORMAL LOW (ref >60)
GFR, EST NON AFRICAN AMERICAN: 48 mL/min — AB (ref >60)
Glucose, Bld: 159 mg/dL — ABNORMAL HIGH (ref 65–99)
POTASSIUM: 4.1 mmol/L (ref 3.5–5.1)
Sodium: 138 mmol/L (ref 135–145)
TOTAL PROTEIN: 8.4 g/dL — AB (ref 6.5–8.1)

## 2016-06-04 LAB — LIPASE, BLOOD: LIPASE: 32 U/L (ref 11–51)

## 2016-06-04 MED ORDER — ONDANSETRON HCL 4 MG/2ML IJ SOLN
4.0000 mg | Freq: Once | INTRAMUSCULAR | Status: AC
Start: 2016-06-04 — End: 2016-06-04
  Administered 2016-06-04: 4 mg via INTRAVENOUS
  Filled 2016-06-04: qty 2

## 2016-06-04 MED ORDER — SULFAMETHOXAZOLE-TRIMETHOPRIM 800-160 MG PO TABS: 1.0000 | ORAL_TABLET | Freq: Two times a day (BID) | ORAL | 0 refills | Status: AC

## 2016-06-04 MED ORDER — SODIUM CHLORIDE 0.9 % IV BOLUS (SEPSIS)
1000.0000 mL | Freq: Once | INTRAVENOUS | Status: AC
Start: 2016-06-04 — End: 2016-06-04
  Administered 2016-06-04: 1000 mL via INTRAVENOUS

## 2016-06-04 MED ORDER — ONDANSETRON 4 MG PO TBDP: ORAL_TABLET | ORAL | 0 refills | Status: AC

## 2016-06-04 NOTE — ED Triage Notes (Signed)
Pt reports sinus drainage x 3 days. Pt has had emesis but feels like it comes from the sinus drainage she is having.

## 2016-06-04 NOTE — ED Notes (Signed)
Pt asked for urine sample, pt does not have to urinate at this time.

## 2016-06-04 NOTE — ED Notes (Signed)
Pt made aware to return if symptoms worsen or if any life threatening symptoms occur.   

## 2016-06-04 NOTE — ED Provider Notes (Signed)
[facility]-EMERGENCY DEPT Provider Note   CSN: [id number] Arrival date & time: 06/04/16  1049 By signing my name below, I, [name], attest that this documentation has been prepared under the direction and in the presence of Tamara Berkshire, MD. Electronically Signed: Linus Reilly, ED Scribe. 06/04/16. 12:34 PM.  History   Chief Complaint Chief Complaint  Patient presents with  . Emesis    Emesis   This is a new problem. The current episode started more than 2 days ago. The problem occurs 2 to 4 times per day. The problem has not changed since onset.Emesis appearance: "globs of mucous" There has been no fever. Pertinent negatives include no abdominal pain, no cough, no diarrhea and no headaches.   HPI Comments: [name] is a 60 y.o. female who presents to the Emergency Department with a PMHx of DM and HTN complaining of a gradual onset of emesis for the past 3 days. She states she expels "globs of mucous" due to ongoing sinus drainage. Pt states she was seen by her PCP for this concern who prescribed her a nasal spray. She has a follow up appointment with him on 07/15/15. Pt denies any fevers, chills, CP,SOB, diarrhea or any other symptoms at this time.   Past Medical History:  Diagnosis Date  . Carotid arterial disease (HCC)    RIGHT CAROTID BRUIT WITH 40-60% STENOSIS BILATERALLY  . Coronary artery disease 12-2008   s/p Inferior NSTEMI with PCI to the RCA in 2010; s/p CABG in October 2012  . Diabetes mellitus   . Dyslipidemia   . Hypertension   . MI (myocardial infarction) 12-2008  . Polysubstance abuse    WITH TOBACCO, ALCOHOL, AND CRACK COCAINE. NOW DRUG FREE FOR THE PAST YEAR  . PVD (peripheral vascular disease) (HCC)    L subclavian stenosis  . Tobacco abuse    Patient Active Problem List   Diagnosis Date Noted  . Carotid arterial disease (HCC)   . Dyslipidemia   . Hypertension   . Tobacco abuse   . Coronary artery disease 12/10/2008  . MI (myocardial infarction)  12/10/2008   Past Surgical History:  Procedure Laterality Date  . CERVICAL FUSION    . CORONARY ANGIOPLASTY  12/2008   RIGHT CORONARY   . REMOVAL OF COLON POLYPS    . S/P CABG x 4  May 10, 2011   Per Dr. Maren Beach; SVG to RCA, SVG to distal LAD, SVG to 1st DX and SVG to OM; The LIMA was harvested as a free graft but not used due to the small size secondary to subclavian stenosis  . VAGINAL HYSTERECTOMY     OB History    Gravida Para Term Preterm AB Living   2 2 2     2    SAB TAB Ectopic Multiple Live Births                 Home Medications    Prior to Admission medications   Medication Sig Start Date End Date Taking? Authorizing Provider  ARIPiprazole (ABILIFY) 2 MG tablet Take 2 mg by mouth daily.    Historical Provider, MD  aspirin 325 MG EC tablet Take 325 mg by mouth daily.      Historical Provider, MD  diazepam (VALIUM) 10 MG tablet Take 10 mg by mouth at bedtime as needed for anxiety. Take one half to one tablet a day    Historical Provider, MD  escitalopram (LEXAPRO) 20 MG tablet Take 20 mg by mouth daily.  Historical Provider, MD  eszopiclone (LUNESTA) 2 MG TABS Take 2 mg by mouth as needed. Take immediately before bedtime  for sleep    Historical Provider, MD  famotidine (PEPCID) 20 MG tablet Take 1 tablet (20 mg total) by mouth 2 (two) times daily. 06/29/15   [name], PA-C  metFORMIN (GLUCOPHAGE-XR) 500 MG 24 hr tablet Take 1 tablet by mouth Daily. 03/29/11   Historical Provider, MD  metoprolol succinate (TOPROL-XL) 25 MG 24 hr tablet TAKE ONE (1) TABLET BY MOUTH EVERY DAY 07/26/14   Tamara M [place], MD  naproxen (NAPROSYN) 500 MG tablet Take 1 tablet (500 mg total) by mouth 2 (two) times daily. 06/29/15   [name], PA-C  nitroGLYCERIN (NITROSTAT) 0.4 MG SL tablet Place 1 tablet (0.4 mg total) under the tongue every 5 (five) minutes as needed. 06/11/11   [name], NP  potassium chloride SA (K-DUR,KLOR-CON) 20 MEQ tablet Take 1 tablet (20 mEq total) by mouth  daily. 11/01/12   [name], NP  rosuvastatin (CRESTOR) 20 MG tablet Take 1 tablet (20 mg total) by mouth daily. 09/04/13   [name], NP  thyroid (ARMOUR) 60 MG tablet Take 90 mg by mouth daily.     Historical Provider, MD  traMADol (ULTRAM) 50 MG tablet Take 1 tablet (50 mg total) by mouth every 6 (six) hours as needed. 06/29/15   [name], PA-C   Family History Family History  Problem Relation Age of Onset  . Coronary artery disease Mother   . Heart disease Mother    Social History Social History  Substance Use Topics  . Smoking status: Current Every Day Smoker    Packs/day: 1.00    Years: 35.00    Types: Cigarettes  . Smokeless tobacco: Never Used  . Alcohol use No   Allergies   Darvocet [propoxyphene n-acetaminophen] and Erythromycin  Review of Systems Review of Systems  Constitutional: Negative for appetite change and fatigue.  HENT: Negative for congestion, ear discharge and sinus pressure.   Eyes: Negative for discharge.  Respiratory: Negative for cough.   Cardiovascular: Negative for chest pain.  Gastrointestinal: Positive for vomiting. Negative for abdominal pain and diarrhea.  Genitourinary: Negative for frequency and hematuria.  Musculoskeletal: Negative for back pain.  Skin: Negative for rash.  Neurological: Negative for seizures and headaches.  Psychiatric/Behavioral: Negative for hallucinations.   Physical Exam Updated Vital Signs BP 132/93 (BP Location: Left Arm)   Pulse (!) 57   Temp 98.5 F (36.9 C) (Oral)   Resp 16   Ht 5\' 7"  (1.702 m)   Wt 160 lb (72.6 kg)   SpO2 98%   BMI 25.06 kg/m   Physical Exam  Constitutional: She is oriented to person, place, and time. She appears well-developed.  HENT:  Head: Normocephalic.  Mouth/Throat: Mucous membranes are dry.  Eyes: Conjunctivae and EOM are normal. No scleral icterus.  Neck: Neck supple. No thyromegaly present.  Cardiovascular: Normal rate and regular rhythm.  Exam reveals no  gallop and no friction rub.   No murmur heard. Pulmonary/Chest: No stridor. She has no wheezes. She has no rales. She exhibits no tenderness.  Abdominal: She exhibits no distension. There is no tenderness. There is no rebound.  Musculoskeletal: Normal range of motion. She exhibits no edema.  Lymphadenopathy:    She has no cervical adenopathy.  Neurological: She is oriented to person, place, and time. She exhibits normal muscle tone. Coordination normal.  Skin: No rash noted. No erythema.  Psychiatric:  She has a normal mood and affect. Her behavior is normal.   ED Treatments / Results  DIAGNOSTIC STUDIES: Oxygen Saturation is 98% on room air, normal by my interpretation.    COORDINATION OF CARE: 12:37 PM Discussed treatment plan with pt at bedside and pt agreed to plan.  Labs (all labs ordered are listed, but only abnormal results are displayed) Labs Reviewed  LIPASE, BLOOD  COMPREHENSIVE METABOLIC PANEL  CBC  URINALYSIS, ROUTINE W REFLEX MICROSCOPIC (NOT AT [facility])   EKG  EKG Interpretation None      Radiology No results found.  Procedures Procedures (including critical care time)  Medications Ordered in ED Medications - No data to display  Initial Impression / Assessment and Plan / ED Course  I have reviewed the triage vital signs and the nursing notes.  Pertinent labs & imaging results that were available during my care of the patient were reviewed by me and considered in my medical decision making (see chart for details).  Clinical Course    Sinusitis and vomiting.  Labs unremarkable,   tx with bactrim and zofran and follow up   Final Clinical Impressions(s) / ED Diagnoses   Final diagnoses:  None    New Prescriptions New Prescriptions   No medications on file   The chart was scribed for me under my direct supervision.  I personally performed the history, physical, and medical decision making and all procedures in the evaluation of this patient.[name].      Jerrian Mells, MD 06/04/16 [phone]

## 2016-06-04 NOTE — Discharge Instructions (Signed)
Drink plenty of fluids.  Follow up in 1-2 weeks if not improving

## 2016-11-09 DEATH — deceased
